# Patient Record
Sex: Female | Born: 1945 | Race: White | Hispanic: No | Marital: Married | Smoking: Never smoker
Health system: Southern US, Community
[De-identification: ages and names within clinical notes are randomized; demographics above are authoritative.]

## PROBLEM LIST (undated history)

## (undated) DIAGNOSIS — F32A Depression, unspecified: Secondary | ICD-10-CM

## (undated) DIAGNOSIS — J45909 Unspecified asthma, uncomplicated: Secondary | ICD-10-CM

## (undated) DIAGNOSIS — E785 Hyperlipidemia, unspecified: Secondary | ICD-10-CM

## (undated) DIAGNOSIS — M1990 Unspecified osteoarthritis, unspecified site: Secondary | ICD-10-CM

## (undated) DIAGNOSIS — I10 Essential (primary) hypertension: Secondary | ICD-10-CM

## (undated) DIAGNOSIS — J449 Chronic obstructive pulmonary disease, unspecified: Secondary | ICD-10-CM

## (undated) DIAGNOSIS — E119 Type 2 diabetes mellitus without complications: Secondary | ICD-10-CM

## (undated) HISTORY — DX: Hyperlipidemia, unspecified: E78.5

## (undated) HISTORY — DX: Unspecified osteoarthritis, unspecified site: M19.90

## (undated) HISTORY — DX: Chronic obstructive pulmonary disease, unspecified: J44.9

## (undated) HISTORY — PX: TOTAL VAGINAL HYSTERECTOMY: SHX2548

## (undated) HISTORY — DX: Type 2 diabetes mellitus without complications: E11.9

## (undated) HISTORY — PX: BREAST BIOPSY: SHX20

## (undated) HISTORY — DX: Depression, unspecified: F32.A

## (undated) HISTORY — DX: Essential (primary) hypertension: I10

## (undated) HISTORY — PX: TUBAL LIGATION: SHX77

## (undated) HISTORY — DX: Unspecified asthma, uncomplicated: J45.909

---

## 1998-12-09 HISTORY — PX: BLADDER SURGERY: SHX569

## 2015-10-11 ENCOUNTER — Other Ambulatory Visit: Payer: Self-pay | Admitting: Family Medicine

## 2015-10-11 DIAGNOSIS — Z1231 Encounter for screening mammogram for malignant neoplasm of breast: Secondary | ICD-10-CM

## 2015-10-20 ENCOUNTER — Ambulatory Visit
Admission: RE | Admit: 2015-10-20 | Discharge: 2015-10-20 | Disposition: A | Discharge status: Home / Self Care | Payer: Medicare Other | Source: Ambulatory Visit | Attending: Family Medicine | Admitting: Family Medicine

## 2015-10-20 ENCOUNTER — Other Ambulatory Visit: Payer: Self-pay | Admitting: Family Medicine

## 2015-10-20 DIAGNOSIS — Z1231 Encounter for screening mammogram for malignant neoplasm of breast: Secondary | ICD-10-CM | POA: Insufficient documentation

## 2016-12-09 HISTORY — PX: ABDOMINAL HYSTERECTOMY: SHX81

## 2018-01-27 ENCOUNTER — Other Ambulatory Visit: Payer: Self-pay | Admitting: Family Medicine

## 2018-01-27 DIAGNOSIS — Z1239 Encounter for other screening for malignant neoplasm of breast: Secondary | ICD-10-CM

## 2018-01-30 ENCOUNTER — Ambulatory Visit
Admission: RE | Admit: 2018-01-30 | Discharge: 2018-01-30 | Disposition: A | Discharge status: Home / Self Care | Payer: Medicare Other | Source: Ambulatory Visit | Attending: Family Medicine | Admitting: Family Medicine

## 2018-01-30 DIAGNOSIS — Z1231 Encounter for screening mammogram for malignant neoplasm of breast: Secondary | ICD-10-CM | POA: Insufficient documentation

## 2018-01-30 DIAGNOSIS — Z1239 Encounter for other screening for malignant neoplasm of breast: Secondary | ICD-10-CM

## 2018-02-02 ENCOUNTER — Inpatient Hospital Stay
Admission: RE | Admit: 2018-02-02 | Discharge: 2018-02-02 | Disposition: A | Discharge status: Home / Self Care | Payer: Self-pay | Source: Ambulatory Visit | Attending: *Deleted | Admitting: *Deleted

## 2018-02-02 ENCOUNTER — Other Ambulatory Visit: Payer: Self-pay | Admitting: *Deleted

## 2018-02-02 DIAGNOSIS — Z9289 Personal history of other medical treatment: Secondary | ICD-10-CM

## 2018-08-09 DIAGNOSIS — C801 Malignant (primary) neoplasm, unspecified: Secondary | ICD-10-CM

## 2018-08-09 HISTORY — DX: Malignant (primary) neoplasm, unspecified: C80.1

## 2019-02-02 ENCOUNTER — Other Ambulatory Visit: Payer: Self-pay | Admitting: Family Medicine

## 2019-02-02 DIAGNOSIS — Z1231 Encounter for screening mammogram for malignant neoplasm of breast: Secondary | ICD-10-CM

## 2019-02-11 ENCOUNTER — Ambulatory Visit
Admission: RE | Admit: 2019-02-11 | Discharge: 2019-02-11 | Disposition: A | Discharge status: Home / Self Care | Payer: Medicare Other | Source: Ambulatory Visit | Attending: Family Medicine | Admitting: Family Medicine

## 2019-02-11 DIAGNOSIS — Z1231 Encounter for screening mammogram for malignant neoplasm of breast: Secondary | ICD-10-CM | POA: Diagnosis present

## 2019-02-12 ENCOUNTER — Ambulatory Visit: Payer: PRIVATE HEALTH INSURANCE

## 2019-12-30 ENCOUNTER — Other Ambulatory Visit: Payer: Self-pay | Admitting: Family Medicine

## 2019-12-30 DIAGNOSIS — Z1231 Encounter for screening mammogram for malignant neoplasm of breast: Secondary | ICD-10-CM

## 2020-02-17 ENCOUNTER — Ambulatory Visit
Admission: RE | Admit: 2020-02-17 | Discharge: 2020-02-17 | Disposition: A | Discharge status: Home / Self Care | Payer: Medicare Other | Source: Ambulatory Visit | Attending: Family Medicine | Admitting: Family Medicine

## 2020-02-17 DIAGNOSIS — Z1231 Encounter for screening mammogram for malignant neoplasm of breast: Secondary | ICD-10-CM | POA: Diagnosis not present

## 2020-03-23 IMAGING — MG DIGITAL SCREENING BILAT W/ TOMO W/ CAD
8 series · 8 of 24 positions shown · non-contrast
Comparison: Previous exam(s).

CLINICAL DATA: Screening.

EXAM:
DIGITAL SCREENING BILATERAL MAMMOGRAM WITH TOMO AND CAD

[R MLO synth-2D]
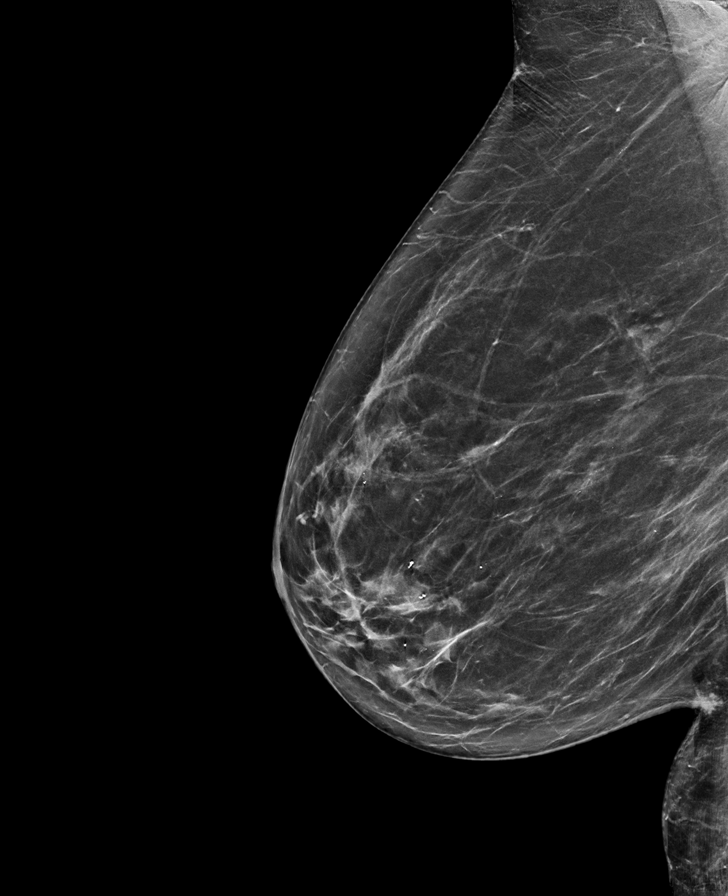

[L MLO synth-2D]
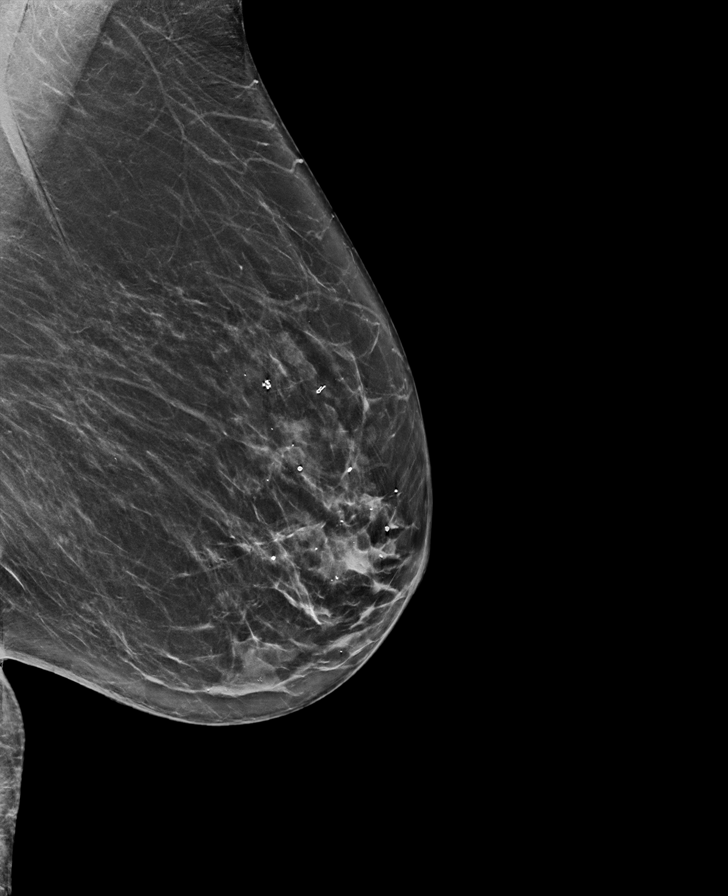

[R CC synth-2D]
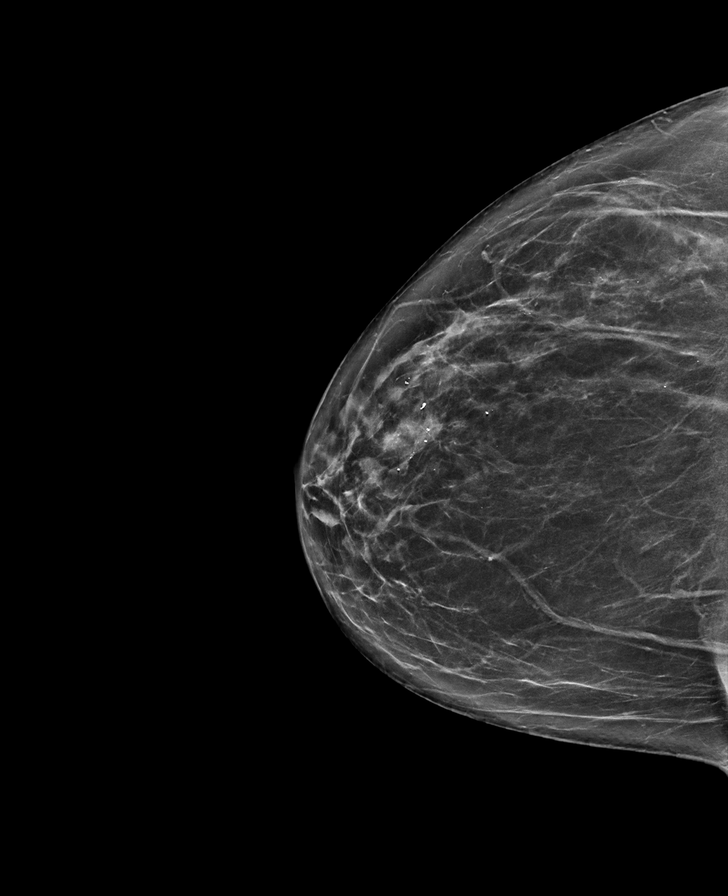

[L CC synth-2D]
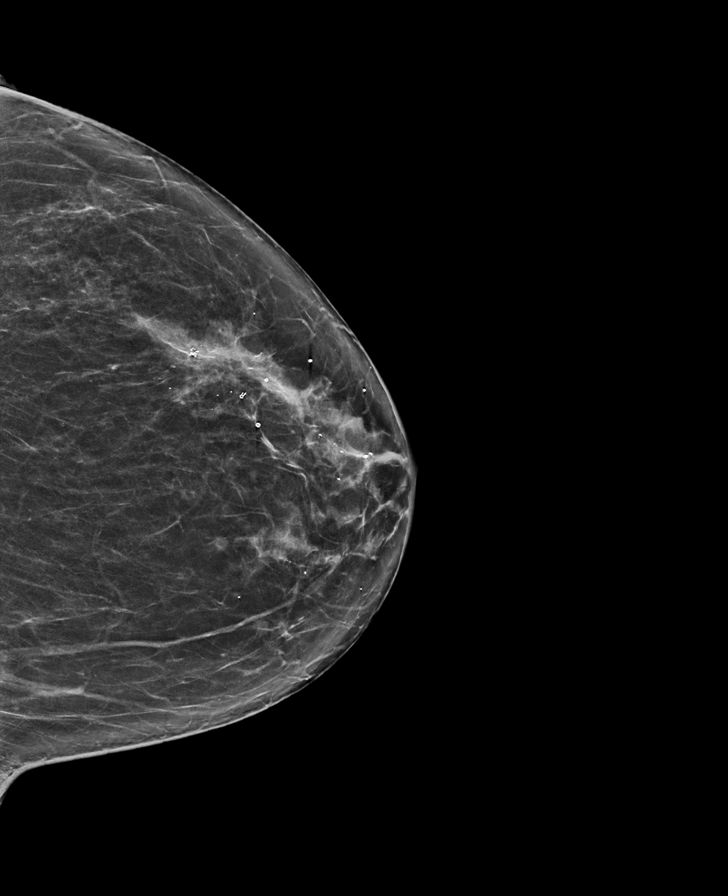

[L CC tomo · tomo slice 35/69.0]
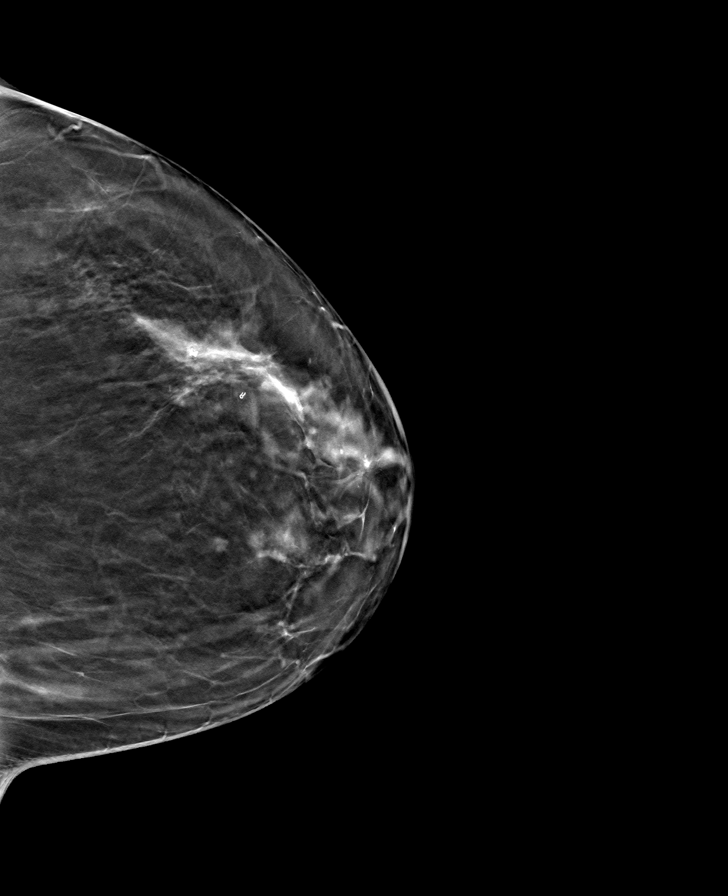

[R CC tomo · tomo slice 37/74.0]
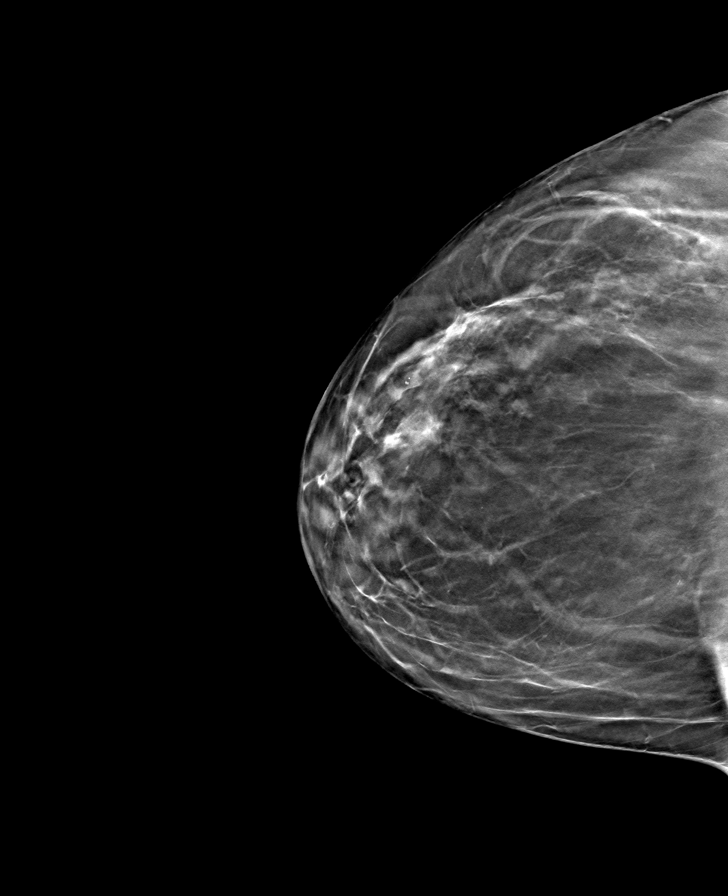

[R MLO tomo · tomo slice 37/73.0]
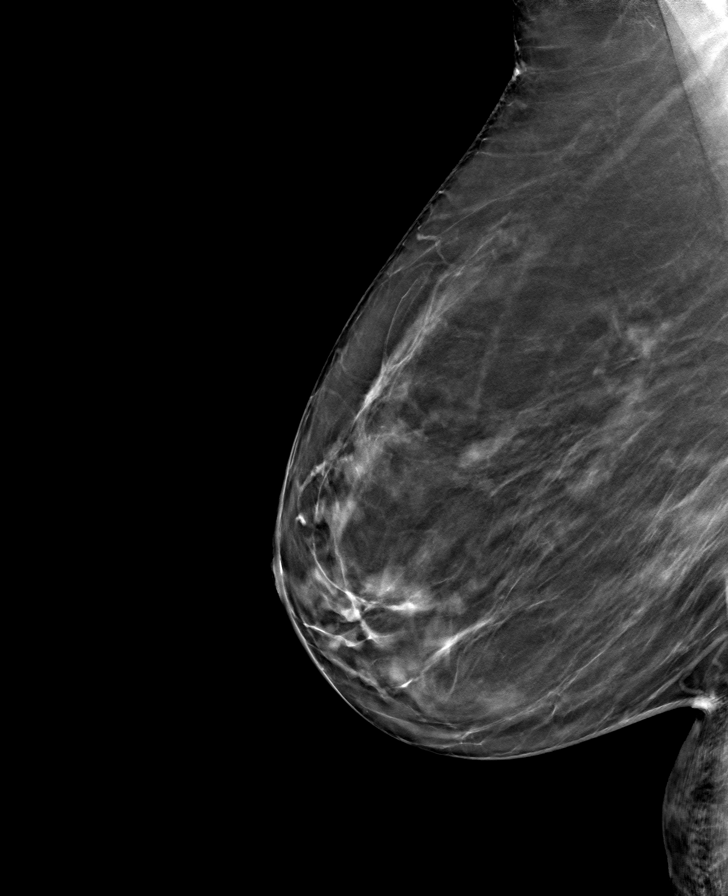

[L MLO tomo · tomo slice 39/77.0]
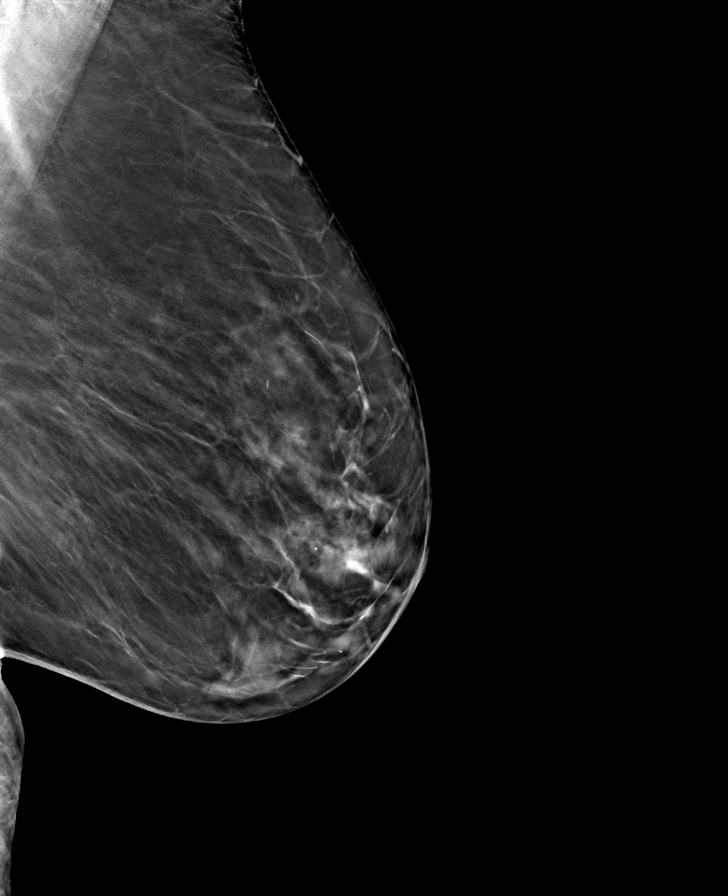

[8 of 24 positions shown; findings below may reference images not displayed]

ACR Breast Density Category c: The breast tissue is heterogeneously
dense, which may obscure small masses.
FINDINGS: There are no findings suspicious for malignancy. Images were
processed with CAD.
IMPRESSION: No mammographic evidence of malignancy. A result letter of this
screening mammogram will be mailed directly to the patient.

RECOMMENDATION:
Screening mammogram in one year. (Code:[id number]-[id number])

BI-RADS CATEGORY  1: Negative.

## 2021-02-05 ENCOUNTER — Other Ambulatory Visit: Payer: Self-pay | Admitting: Family Medicine

## 2021-02-05 DIAGNOSIS — Z1231 Encounter for screening mammogram for malignant neoplasm of breast: Secondary | ICD-10-CM

## 2021-02-19 ENCOUNTER — Ambulatory Visit: Payer: PRIVATE HEALTH INSURANCE

## 2022-06-24 ENCOUNTER — Ambulatory Visit: Payer: Self-pay | Admitting: Nurse Practitioner

## 2022-07-15 LAB — HM DIABETES EYE EXAM

## 2022-07-17 LAB — HM PAP SMEAR: HM Pap smear: NORMAL

## 2022-08-15 LAB — HM DIABETES EYE EXAM

## 2022-08-19 ENCOUNTER — Other Ambulatory Visit: Payer: Self-pay | Admitting: Internal Medicine

## 2022-08-20 ENCOUNTER — Ambulatory Visit
Admission: RE | Admit: 2022-08-20 | Discharge: 2022-08-20 | Disposition: A | Discharge status: Home / Self Care | Payer: Medicare Other | Attending: Internal Medicine | Admitting: Internal Medicine

## 2022-08-20 ENCOUNTER — Ambulatory Visit (INDEPENDENT_AMBULATORY_CARE_PROVIDER_SITE_OTHER): Payer: Medicare Other | Admitting: Internal Medicine

## 2022-08-20 ENCOUNTER — Encounter: Payer: Self-pay | Admitting: Internal Medicine

## 2022-08-20 ENCOUNTER — Ambulatory Visit
Admission: RE | Admit: 2022-08-20 | Discharge: 2022-08-20 | Disposition: A | Discharge status: Home / Self Care | Payer: Medicare Other | Source: Ambulatory Visit | Attending: Internal Medicine | Admitting: Internal Medicine

## 2022-08-20 ENCOUNTER — Other Ambulatory Visit: Payer: Self-pay | Admitting: Internal Medicine

## 2022-08-20 VITALS — BP 138/70 | HR 60 | Ht 61.0 in | Wt 126.0 lb

## 2022-08-20 DIAGNOSIS — Z8542 Personal history of malignant neoplasm of other parts of uterus: Secondary | ICD-10-CM

## 2022-08-20 DIAGNOSIS — E1169 Type 2 diabetes mellitus with other specified complication: Secondary | ICD-10-CM

## 2022-08-20 DIAGNOSIS — Z23 Encounter for immunization: Secondary | ICD-10-CM | POA: Diagnosis not present

## 2022-08-20 DIAGNOSIS — I10 Essential (primary) hypertension: Secondary | ICD-10-CM

## 2022-08-20 DIAGNOSIS — R7303 Prediabetes: Secondary | ICD-10-CM | POA: Diagnosis not present

## 2022-08-20 DIAGNOSIS — M79672 Pain in left foot: Secondary | ICD-10-CM

## 2022-08-20 DIAGNOSIS — J4520 Mild intermittent asthma, uncomplicated: Secondary | ICD-10-CM | POA: Insufficient documentation

## 2022-08-20 DIAGNOSIS — E785 Hyperlipidemia, unspecified: Secondary | ICD-10-CM

## 2022-08-20 DIAGNOSIS — E118 Type 2 diabetes mellitus with unspecified complications: Secondary | ICD-10-CM

## 2022-08-20 MED ORDER — POTASSIUM CHLORIDE ER 10 MEQ PO TBCR: 10.0000 meq | EXTENDED_RELEASE_TABLET | Freq: Every day | ORAL | 1 refills | Status: DC

## 2022-08-20 MED ORDER — FLUTICASONE-SALMETEROL 100-50 MCG/ACT IN AEPB: 1.0000 | INHALATION_SPRAY | Freq: Two times a day (BID) | RESPIRATORY_TRACT | 1 refills | Status: DC

## 2022-08-20 MED ORDER — HYDROCHLOROTHIAZIDE 25 MG PO TABS: ORAL_TABLET | ORAL | 1 refills | Status: DC

## 2022-08-20 MED ORDER — BENAZEPRIL HCL 40 MG PO TABS: 40.0000 mg | ORAL_TABLET | Freq: Every day | ORAL | 1 refills | Status: DC

## 2022-08-20 MED ORDER — CARVEDILOL 12.5 MG PO TABS: 12.5000 mg | ORAL_TABLET | Freq: Two times a day (BID) | ORAL | 1 refills | Status: DC

## 2022-08-20 MED ORDER — ROSUVASTATIN CALCIUM 5 MG PO TABS: 5.0000 mg | ORAL_TABLET | Freq: Every day | ORAL | 1 refills | Status: DC

## 2022-08-20 MED ORDER — MONTELUKAST SODIUM 10 MG PO TABS: ORAL_TABLET | ORAL | 1 refills | Status: DC

## 2022-08-20 NOTE — Patient Instructions (Addendum)
Call the [facility] for a list of your vaccines:  Pneumonia  Shingles  Tetanus  Please ask for a copy of your mammogram

## 2022-08-20 NOTE — Progress Notes (Signed)
Date:  08/20/2022   Name:  Kimberly Robertson   DOB:  May 31, 1946   MRN:  [id number]   Chief Complaint: Foot Pain (Left foot ) and Flu Vaccine  Foot Pain This is a new problem. Episode onset: X1 month. The problem has been unchanged. Associated symptoms include arthralgias (foot pain). Pertinent negatives include no abdominal pain, chest pain, coughing, fatigue, fever, headaches, numbness or weakness. The symptoms are aggravated by walking. She has tried nothing for the symptoms.  Hypertension This is a chronic problem. The problem is controlled. Pertinent negatives include no chest pain, headaches, palpitations or shortness of breath. Past treatments include ACE inhibitors, beta blockers and diuretics.  Diabetes She presents for her follow-up diabetic visit. She has type 2 diabetes mellitus. Pertinent negatives for hypoglycemia include no dizziness, headaches, nervousness/anxiousness or tremors. Pertinent negatives for diabetes include no chest pain, no fatigue, no polydipsia, no polyuria and no weakness. Current diabetic treatment includes diet.  Hyperlipidemia This is a chronic problem. Pertinent negatives include no chest pain or shortness of breath. Current antihyperlipidemic treatment includes statins.    No results found for: "NA", "K", "CO2", "GLUCOSE", "BUN", "CREATININE", "CALCIUM", "EGFR", "GFRNONAA" No results found for: "CHOL", "HDL", "LDLCALC", "LDLDIRECT", "TRIG", "CHOLHDL" No results found for: "TSH" No results found for: "HGBA1C" No results found for: "WBC", "HGB", "HCT", "MCV", "PLT" No results found for: "ALT", "AST", "GGT", "ALKPHOS", "BILITOT" No results found for: "25OHVITD2", "25OHVITD3", "VD25OH"   Review of Systems  Constitutional:  Negative for appetite change, fatigue, fever and unexpected weight change.  HENT:  Negative for nosebleeds, tinnitus and trouble swallowing.   Eyes:  Negative for visual disturbance.  Respiratory:  Negative for cough, chest tightness,  shortness of breath and wheezing.   Cardiovascular:  Negative for chest pain, palpitations and leg swelling.  Gastrointestinal:  Negative for abdominal pain, constipation and diarrhea.  Endocrine: Negative for polydipsia and polyuria.  Genitourinary:  Negative for dysuria and hematuria.  Musculoskeletal:  Positive for arthralgias (foot pain).  Neurological:  Negative for dizziness, tremors, weakness, light-headedness, numbness and headaches.  Psychiatric/Behavioral:  Negative for dysphoric mood and sleep disturbance. The patient is not nervous/anxious.     Patient Active Problem List   Diagnosis Date Noted   Essential hypertension 08/20/2022   Type II diabetes mellitus with complication ([facility]) 09/32/3557   Hyperlipidemia associated with type 2 diabetes mellitus ([facility]) 08/20/2022   Mild intermittent asthma without complication 32/20/2542    Allergies  Allergen Reactions   Clarithromycin Other (See Comments)    Past Surgical History:  Procedure Laterality Date   ABDOMINAL HYSTERECTOMY  2018   BLADDER SURGERY  2000   and 2018   BREAST BIOPSY Left    2004-benign    Social History   Tobacco Use   Smoking status: Never   Smokeless tobacco: Never  Vaping Use   Vaping Use: Never used  Substance Use Topics   Alcohol use: Not Currently   Drug use: Never     Medication list has been reviewed and updated.  Current Meds  Medication Sig   albuterol (VENTOLIN HFA) 108 (90 Base) MCG/ACT inhaler Inhale into the lungs every 6 (six) hours as needed for wheezing or shortness of breath.   ASPIRIN 81 PO aspirin   b complex vitamins capsule Take 1 capsule by mouth daily.   benazepril (LOTENSIN) 40 MG tablet Take 1 tablet every day by oral route.   carvedilol (COREG) 12.5 MG tablet Take 1 tablet twice a day by oral route.  citalopram (CELEXA) 10 MG tablet Take 10 mg by mouth daily.   fluticasone-salmeterol (ADVAIR) 100-50 MCG/ACT AEPB Inhale 1 puff into the lungs 2 (two) times daily.    hydrochlorothiazide (HYDRODIURIL) 25 MG tablet Take 1 tablet every day by oral route.   montelukast (SINGULAIR) 10 MG tablet Take 1 tablet every day by oral route.   multivitamin-lutein (OCUVITE-LUTEIN) CAPS capsule Take 1 capsule by mouth daily.   potassium chloride (KLOR-CON) 10 MEQ tablet Take 10 mEq by mouth daily.   rosuvastatin (CRESTOR) 5 MG tablet Take 5 mg by mouth daily.   UNABLE TO FIND in the morning and at bedtime. Med Name: Sciatic Ease   [DISCONTINUED] rosuvastatin (CRESTOR) 5 MG tablet Take 5 mg by mouth daily.       08/20/2022    2:37 PM  GAD 7 : Generalized Anxiety Score  Nervous, Anxious, on Edge 0  Control/stop worrying 2  Worry too much - different things 0  Trouble relaxing 0  Restless 0  Easily annoyed or irritable 0  Afraid - awful might happen 0  Total GAD 7 Score 2  Anxiety Difficulty Not difficult at all       08/20/2022    2:36 PM  Depression screen PHQ 2/9  Decreased Interest 0  Down, Depressed, Hopeless 0  PHQ - 2 Score 0  Altered sleeping 0  Tired, decreased energy 0  Change in appetite 0  Feeling bad or failure about yourself  0  Trouble concentrating 0  Moving slowly or fidgety/restless 0  Suicidal thoughts 0  PHQ-9 Score 0  Difficult doing work/chores Not difficult at all    BP Readings from Last 3 Encounters:  08/20/22 138/70    Physical Exam Vitals and nursing note reviewed.  Constitutional:      General: She is not in acute distress.    Appearance: Normal appearance. She is well-developed.  HENT:     Head: Normocephalic and atraumatic.  Neck:     Vascular: No carotid bruit.  Cardiovascular:     Rate and Rhythm: Normal rate and regular rhythm.     Heart sounds: No murmur heard. Pulmonary:     Effort: Pulmonary effort is normal. No respiratory distress.     Breath sounds: No wheezing or rhonchi.  Musculoskeletal:        General: Normal range of motion.     Cervical back: Normal range of motion.     Right lower leg: No  edema.     Left lower leg: No edema.  Feet:     Comments: Tender over left lateral distal forefoot No deformity or swelling, redness Lymphadenopathy:     Cervical: No cervical adenopathy.  Skin:    General: Skin is warm and dry.     Capillary Refill: Capillary refill takes less than 2 seconds.     Findings: No rash.  Neurological:     General: No focal deficit present.     Mental Status: She is alert and oriented to person, place, and time.  Psychiatric:        Mood and Affect: Mood normal.        Behavior: Behavior normal.     Wt Readings from Last 3 Encounters:  08/20/22 126 lb (57.2 kg)    BP 138/70 (BP Location: Left Arm, Cuff Size: Normal)   Pulse 60   Ht '5\' 1"'  (1.549 m)   Wt 126 lb (57.2 kg)   SpO2 98%   BMI 23.81 kg/m   Assessment and  Plan: 1. Essential hypertension Clinically stable exam with well controlled BP. Tolerating medications without side effects at this time. Pt to continue current regimen and low sodium diet; benefits of regular exercise as able discussed. - CBC with Differential/Platelet - Comprehensive metabolic panel - potassium chloride (KLOR-CON) 10 MEQ tablet; Take 1 tablet (10 mEq total) by mouth daily.  Dispense: 90 tablet; Refill: 1 - hydrochlorothiazide (HYDRODIURIL) 25 MG tablet; Take 1 tablet every day by oral route.  Dispense: 90 tablet; Refill: 1 - carvedilol (COREG) 12.5 MG tablet; Take 1 tablet (12.5 mg total) by mouth 2 (two) times daily with a meal.  Dispense: 180 tablet; Refill: 1 - benazepril (LOTENSIN) 40 MG tablet; Take 1 tablet (40 mg total) by mouth daily.  Dispense: 90 tablet; Refill: 1  2. Foot pain, left Suspect OA vs stress fracture - DG Foot Complete Left; Future  3. Prediabetes Not currently on any medications Will check labs and advise Low carb diet for now - Comprehensive metabolic panel - Hemoglobin A1c  4. Hyperlipidemia associated with type 2 diabetes mellitus (HCC) - Lipid panel - rosuvastatin (CRESTOR) 5  MG tablet; Take 1 tablet (5 mg total) by mouth daily.  Dispense: 90 tablet; Refill: 1  5. Mild intermittent asthma without complication Stable with only intermittent symptoms  She uses nebulizer albuterol PRN - montelukast (SINGULAIR) 10 MG tablet; Take 1 tablet every day by oral route.  Dispense: 90 tablet; Refill: 1 - fluticasone-salmeterol (ADVAIR) 100-50 MCG/ACT AEPB; Inhale 1 puff into the lungs 2 (two) times daily.  Dispense: 180 each; Refill: 1   Partially dictated using Editor, commissioning. Any errors are unintentional.  Halina Maidens, MD [facility]  08/20/2022

## 2022-08-21 LAB — COMPREHENSIVE METABOLIC PANEL
ALT: 23 IU/L (ref 0–32)
AST: 25 IU/L (ref 0–40)
Albumin/Globulin Ratio: 1.9 (ref 1.2–2.2)
Albumin: 4.7 g/dL (ref 3.8–4.8)
Alkaline Phosphatase: 89 IU/L (ref 44–121)
BUN/Creatinine Ratio: 18 (ref 12–28)
BUN: 14 mg/dL (ref 8–27)
Bilirubin Total: 0.5 mg/dL (ref 0.0–1.2)
CO2: 26 mmol/L (ref 20–29)
Calcium: 10.2 mg/dL (ref 8.7–10.3)
Chloride: 100 mmol/L (ref 96–106)
Creatinine, Ser: 0.79 mg/dL (ref 0.57–1.00)
Globulin, Total: 2.5 g/dL (ref 1.5–4.5)
Glucose: 93 mg/dL (ref 70–99)
Potassium: 3.8 mmol/L (ref 3.5–5.2)
Sodium: 140 mmol/L (ref 134–144)
Total Protein: 7.2 g/dL (ref 6.0–8.5)
eGFR: 77 mL/min/{1.73_m2} (ref >59)

## 2022-08-21 LAB — CBC WITH DIFFERENTIAL/PLATELET
Basophils Absolute: 0 10*3/uL (ref 0.0–0.2)
Basos: 1 %
EOS (ABSOLUTE): 0.2 10*3/uL (ref 0.0–0.4)
Eos: 4 %
Hematocrit: 42 % (ref 34.0–46.6)
Hemoglobin: 14.4 g/dL (ref 11.1–15.9)
Immature Grans (Abs): 0 10*3/uL (ref 0.0–0.1)
Immature Granulocytes: 0 %
Lymphocytes Absolute: 1.8 10*3/uL (ref 0.7–3.1)
Lymphs: 34 %
MCH: 31.3 pg (ref 26.6–33.0)
MCHC: 34.3 g/dL (ref 31.5–35.7)
MCV: 91 fL (ref 79–97)
Monocytes Absolute: 0.5 10*3/uL (ref 0.1–0.9)
Monocytes: 9 %
Neutrophils Absolute: 2.8 10*3/uL (ref 1.4–7.0)
Neutrophils: 52 %
Platelets: 213 10*3/uL (ref 150–450)
RBC: 4.6 x10E6/uL (ref 3.77–5.28)
RDW: 12.3 % (ref 11.7–15.4)
WBC: 5.3 10*3/uL (ref 3.4–10.8)

## 2022-08-21 LAB — LIPID PANEL
Chol/HDL Ratio: 2.2 ratio (ref 0.0–4.4)
Cholesterol, Total: 184 mg/dL (ref 100–199)
HDL: 82 mg/dL (ref >39)
LDL Chol Calc (NIH): 83 mg/dL (ref 0–99)
Triglycerides: 107 mg/dL (ref 0–149)
VLDL Cholesterol Cal: 19 mg/dL (ref 5–40)

## 2022-08-21 LAB — HEMOGLOBIN A1C
Est. average glucose Bld gHb Est-mCnc: 143 mg/dL
Hgb A1c MFr Bld: 6.6 % — ABNORMAL HIGH (ref 4.8–5.6)

## 2022-08-22 NOTE — Progress Notes (Signed)
Called pt could not leave VM. VM not set up.  PEC nurse may give results to patient if they return call to clinic, a CRM has been created.  KP

## 2022-08-28 ENCOUNTER — Encounter: Payer: Self-pay | Admitting: Internal Medicine

## 2022-08-28 NOTE — Telephone Encounter (Signed)
FYI

## 2022-08-30 ENCOUNTER — Other Ambulatory Visit: Payer: Self-pay

## 2022-08-30 DIAGNOSIS — Z79899 Other long term (current) drug therapy: Secondary | ICD-10-CM

## 2022-08-30 NOTE — Telephone Encounter (Signed)
DONE

## 2022-08-30 NOTE — Telephone Encounter (Signed)
Pt response.  KP

## 2022-09-03 ENCOUNTER — Other Ambulatory Visit: Payer: Self-pay

## 2022-09-03 DIAGNOSIS — J4520 Mild intermittent asthma, uncomplicated: Secondary | ICD-10-CM

## 2022-09-05 ENCOUNTER — Telehealth: Payer: Self-pay | Admitting: Pharmacist

## 2022-09-05 NOTE — Progress Notes (Signed)
Contacted patient regarding referral for medication access from Glean Hess, MD .   Appointment scheduled   Catie Hedwig Morton, PharmD, [facility] [phone]

## 2022-09-12 ENCOUNTER — Other Ambulatory Visit: Payer: PRIVATE HEALTH INSURANCE | Admitting: Pharmacist

## 2022-09-12 NOTE — Patient Instructions (Addendum)
Cattie,   Call your old pharmacy to see if you have been on "atorvastatin" for your cholesterol, or if they have documented that you had any problems with it, if you have been on it.   Call [facility] at [phone] to discuss the eligibility application.   Call me when you have that completed. Thanks!  Catie Hedwig Morton, PharmD, [facility] [phone]

## 2022-09-12 NOTE — Progress Notes (Signed)
Chief Complaint  Patient presents with   Medication Management    Kimberly Robertson is a 76 y.o. year old female who presented for a telephone visit.   They were referred to the pharmacist by their PCP for assistance in managing medication access.   Subjective:  Care Team: Primary Care Provider: Glean Hess, MD ; Next Scheduled Visit: 12/20/22  Medication Access/Adherence  Current Pharmacy:  [place], [place] - [address] [place] [address] Phone: [phone] Fax: [phone]  [place], [address] [place] [address] Phone: [phone] Fax: [phone]  [facility], [place], [place] [address], [place] [address] Phone: [phone] Fax: [phone]   Patient reports affordability concerns with their medications: Yes  Patient reports access/transportation concerns to their pharmacy: No  Patient reports adherence concerns with their medications:  No    Patient only has Medicare A/B and a G supplement, but no prescription drug coverage. She was previously using a patient assistance savings program with her prior PCP that advertised a $10 copay for her inhalers.    Asthma:  Current medications: Advair 100/50 mcg 1 puff twice daily, albuterol HFA PRN  Hypertension:  Current medications: benazepril 40 mg daily, HCTZ 25 mg daily  Hyperlipidemia/ASCVD Risk Reduction  Current lipid lowering medications: rosuvastatin 5 mg daily  Antiplatelet regimen: aspirin 81 mg daily   Health Maintenance  Health Maintenance Due  Topic Date Due   Diabetic kidney evaluation - Urine ACR  Never done   Hepatitis C Screening  Never done   TETANUS/TDAP  Never done   Pneumonia Vaccine 48+ Years old (1 - PCV) Never done   DEXA SCAN  Never done     Objective: Lab  Results  Component Value Date   HGBA1C 6.6 (H) 08/20/2022    Lab Results  Component Value Date   CREATININE 0.79 08/20/2022   BUN 14 08/20/2022   NA 140 08/20/2022   K 3.8 08/20/2022   CL 100 08/20/2022   CO2 26 08/20/2022    Lab Results  Component Value Date   CHOL 184 08/20/2022   HDL 82 08/20/2022   LDLCALC 83 08/20/2022   TRIG 107 08/20/2022   CHOLHDL 2.2 08/20/2022    Medications Reviewed Today     Reviewed by Osker Mason, RPH-CPP (Pharmacist) on 09/12/22 at 1359  Med List Status: <None>   Medication Order Taking? Sig Documenting Provider Last Dose Status Informant  albuterol (VENTOLIN HFA) 108 (90 Base) MCG/ACT inhaler [id number] Yes Inhale into the lungs every 6 (six) hours as needed for wheezing or shortness of breath. [provider] Taking Active   ASPIRIN 81 PO [id number] Yes aspirin [provider] Taking Active   b complex vitamins capsule [id number] Yes Take 1 capsule by mouth daily. [provider] Taking Active   benazepril (LOTENSIN) 40 MG tablet [id number] Yes Take 1 tablet (40 mg total) by mouth daily. Glean Hess, MD Taking Active   calcium carbonate (OS-CAL) 600 MG tablet [id number] Yes Take 600 mg by mouth 2 (two) times daily with a meal. [provider] Taking Active   carvedilol (COREG) 12.5 MG tablet [id number] Yes Take 1 tablet (12.5 mg total) by mouth 2 (two) times daily with a meal. Glean Hess, MD Taking Active   fluticasone-salmeterol (  ADVAIR) 100-50 MCG/ACT AEPB [id number] Yes Inhale 1 puff into the lungs 2 (two) times daily. Glean Hess, MD Taking Active   hydrochlorothiazide (HYDRODIURIL) 25 MG tablet [id number] Yes Take 1 tablet every day by oral route. Glean Hess, MD Taking Active   montelukast (SINGULAIR) 10 MG tablet [id number] Yes Take 1 tablet every day by oral route. Glean Hess, MD Taking Active   multivitamin-lutein [facility]) CAPS capsule [id number] Yes Take 1  capsule by mouth daily. [provider] Taking Active   potassium chloride (KLOR-CON) 10 MEQ tablet [id number] Yes Take 1 tablet (10 mEq total) by mouth daily. Glean Hess, MD Taking Active   rosuvastatin (CRESTOR) 5 MG tablet [id number] Yes Take 1 tablet (5 mg total) by mouth daily. Glean Hess, MD Taking Active   UNABLE TO FIND [id number]  in the morning and at bedtime. Med Name: Sciatic Ease [provider]  Active               Assessment/Plan:   Discussed income. Due to lack of prescription coverage and income <300% FPL, patient qualifies for [facility] free mail order pharmacy. Provided phone number, patient will call and complete enrollment.   Hypertension: - Currently controlled - Reviewed [facility] formulary. Benazepril is not on formulary, but lisinopril, HCTZ, and the combination are. If patient is deemed eligible, will discuss changing from benazepril to lisinopril with Dr. Army Melia.  Hyperlipidemia/ASCVD Risk Reduction: - Currently controlled.  - Rosuvastatin is not on [facility] formulary, but atorvastatin is. Patient is unsure if she has tried atorvastatin before - appears it has been on her profile but she doesn't remember the name. She will call her old pharmacy to see if she has taken if before. If she needs to stay on rosuvastatin, it is $10 per 90 day supply at [facility] pharmacies. Patient will call me back after she has investigated.    Asthma: - Currently controlled.  - Generic Advair and albuterol are on [facility] formulary. Once patient is eligible, will collaborate with Dr. Army Melia to send refills   Follow Up Plan: patient will call me back after talking with Stem, PharmD, [facility] [phone]

## 2022-09-13 ENCOUNTER — Other Ambulatory Visit: Payer: Self-pay | Admitting: Pharmacist

## 2022-09-13 DIAGNOSIS — E1169 Type 2 diabetes mellitus with other specified complication: Secondary | ICD-10-CM

## 2022-09-13 NOTE — Progress Notes (Signed)
Care Coordination Call  Received voicemail from patient. She called her prior pharmacy; she had previously been on atorvastatin 2016-2020, there was no documentation of prior allergy or intolerance.   She will call [facility] regarding enrollment.   Catie Hedwig Morton, PharmD, [facility] [phone]

## 2022-09-23 ENCOUNTER — Telehealth: Payer: Self-pay | Admitting: Pharmacist

## 2022-09-23 NOTE — Progress Notes (Unsigned)
Attempted to contact patient to follow up on medication access needs.  Left HIPAA compliant message for patient to return my call at their convenience.   Catie Hedwig Morton, PharmD, [facility] [phone]

## 2022-09-24 NOTE — Progress Notes (Signed)
Received voicemail from patient.   Note she had a difficult time getting logged into MyChart and she got a new phone and is having issues checking her voicemail. Her voicemail to me stated that she thinks her daughter took care of getting refills from [facility], but she is unsure. She also mentioned that her husband didn't want her to apply for Medicaid, which I think was a misunderstanding - we did not discuss applying for Medicaid. We had discussed working with [facility], a discount pharmacy for patients without prescription drug insurance.   Called patient back. Left voicemail letting her know to call if she still needs support with medication access.   Catie Hedwig Morton, PharmD, [facility] [phone]

## 2022-10-06 ENCOUNTER — Encounter: Payer: Self-pay | Admitting: Internal Medicine

## 2022-10-08 ENCOUNTER — Encounter: Payer: Self-pay | Admitting: Internal Medicine

## 2022-10-08 ENCOUNTER — Encounter: Payer: Medicare Other | Admitting: Family Medicine

## 2022-10-14 ENCOUNTER — Encounter: Payer: Self-pay | Admitting: Family Medicine

## 2022-10-14 ENCOUNTER — Encounter: Payer: 59 | Admitting: Family Medicine

## 2022-10-14 ENCOUNTER — Ambulatory Visit (INDEPENDENT_AMBULATORY_CARE_PROVIDER_SITE_OTHER): Payer: Medicare Other | Admitting: Family Medicine

## 2022-10-14 VITALS — BP 128/80 | HR 60 | Ht 61.0 in | Wt 129.0 lb

## 2022-10-14 DIAGNOSIS — M19072 Primary osteoarthritis, left ankle and foot: Secondary | ICD-10-CM

## 2022-10-14 MED ORDER — LIDOCAINE 5 % EX PTCH: 1.0000 | MEDICATED_PATCH | Freq: Two times a day (BID) | CUTANEOUS | 2 refills | Status: DC

## 2022-10-14 MED ORDER — DICLOFENAC SODIUM 1 % EX GEL: 2.0000 g | Freq: Four times a day (QID) | CUTANEOUS | 11 refills | Status: DC

## 2022-10-14 NOTE — Patient Instructions (Signed)
-   Use diclofenac 1% (Voltaren gel) up to 4 times a day as needed for foot pain - Can use lidocaine patch for additional pain control - Home exercises provided for foot/ankle - Consider joint health supplements as follows: - Glucosamine/chondroitin - Turmeric - Vitamin D - Fish oil -Contact us for any persistent symptoms despite the above to discuss next steps

## 2022-10-15 NOTE — Assessment & Plan Note (Signed)
Patient with atraumatic left midfoot pain, did have x-rays performed and presents for follow-up.  Noted worse towards the end of the day, described as achiness, no treatments to date, maintains a high level of baseline activity/time on feet.  Examination with tenderness about the metatarsals, lateral midfoot, prominent osteophyte at the dorsum of the midfoot that is nontender.  Secondary tenderness at the first MTP.  Given her radiographs, her pain is most consistent with osteoarthritis, incidentally noted age-indeterminate fracture is essentially asymptomatic.  We discussed various surgical and nonsurgical treatment strategies, at this stage plan on Rx lidocaine patch, topical diclofenac 1%, home exercises, follow-up as needed.  Can consider local ultrasound-guided injections to address local osteoarthritis related pain.

## 2022-10-15 NOTE — Progress Notes (Signed)
     Primary Care / Sports Medicine Office Visit  Patient Information:  Patient ID: Kimberly Robertson, female DOB: November 11, 1946 Age: 76 y.o. MRN: [id number]   Kimberly Robertson is a pleasant 76 y.o. female presenting with the following:  Chief Complaint  Patient presents with   Foot Injury    Not sure how it happened, left foot, bothering her for 2 months    Vitals:   10/14/22 1104  BP: 128/80  Pulse: 60  SpO2: 99%   Vitals:   10/14/22 1104  Weight: 129 lb (58.5 kg)  Height: '5\' 1"'$  (1.549 m)   Body mass index is 24.37 kg/m.  No results found.   Independent interpretation of notes and tests performed by another provider:   Independent interpretation of left foot x-rays reveals prominent midfoot dorsal osteophyte with age-indeterminate lucency, degenerative changes about the midfoot laterally and at the first MTP.  Procedures performed:   None  Pertinent History, Exam, Impression, and Recommendations:   Problem List Items Addressed This Visit       Musculoskeletal and Integument   Osteoarthritis of left foot - Primary    Patient with atraumatic left midfoot pain, did have x-rays performed and presents for follow-up.  Noted worse towards the end of the day, described as achiness, no treatments to date, maintains a high level of baseline activity/time on feet.  Examination with tenderness about the metatarsals, lateral midfoot, prominent osteophyte at the dorsum of the midfoot that is nontender.  Secondary tenderness at the first MTP.  Given her radiographs, her pain is most consistent with osteoarthritis, incidentally noted age-indeterminate fracture is essentially asymptomatic.  We discussed various surgical and nonsurgical treatment strategies, at this stage plan on Rx lidocaine patch, topical diclofenac 1%, home exercises, follow-up as needed.  Can consider local ultrasound-guided injections to address local osteoarthritis related pain.      Relevant Medications   diclofenac  Sodium (VOLTAREN) 1 % GEL   lidocaine (LIDODERM) 5 %     Orders & Medications Meds ordered this encounter  Medications   diclofenac Sodium (VOLTAREN) 1 % GEL    Sig: Apply 2 g topically 4 (four) times daily. To affected joint.    Dispense:  100 g    Refill:  11   lidocaine (LIDODERM) 5 %    Sig: Place 1 patch onto the skin every 12 (twelve) hours. Remove & Discard patch within 12 hours or as directed by MD    Dispense:  30 patch    Refill:  2   No orders of the defined types were placed in this encounter.    No follow-ups on file.     Montel Culver, MD   Primary Care Sports Medicine [place]

## 2022-10-18 ENCOUNTER — Ambulatory Visit (INDEPENDENT_AMBULATORY_CARE_PROVIDER_SITE_OTHER): Payer: Medicare Other

## 2022-10-18 VITALS — Ht 61.0 in | Wt 129.0 lb

## 2022-10-18 DIAGNOSIS — Z0000 Encounter for general adult medical examination without abnormal findings: Secondary | ICD-10-CM

## 2022-10-18 NOTE — Progress Notes (Signed)
Subjective:   Kimberly Robertson is a 76 y.o. female who presents for Medicare Annual (Subsequent) preventive examination.  I connected with  [name] on 10/18/22 by a audio enabled telemedicine application and verified that I am speaking with the correct person using two identifiers.  Patient Location: Home  Provider Location: Office/Clinic  I discussed the limitations of evaluation and management by telemedicine. The patient expressed understanding and agreed to proceed.   Review of Systems    Defer to PCP Cardiac Risk Factors include: advanced age (>80mn, >>71women)     Objective:    Today's Vitals   10/18/22 1326 10/18/22 1332  Weight: 129 lb (58.5 kg)   Height: '5\' 1"'$  (1.549 m)   PainSc: 0-No pain 0-No pain   Body mass index is 24.37 kg/m.     10/18/2022    1:34 PM  Advanced Directives  Does Patient Have a Medical Advance Directive? No  Would patient like information on creating a medical advance directive? No - Guardian declined    Current Medications (verified) Outpatient Encounter Medications as of 10/18/2022  Medication Sig   albuterol (VENTOLIN HFA) 108 (90 Base) MCG/ACT inhaler Inhale into the lungs every 6 (six) hours as needed for wheezing or shortness of breath.   ASPIRIN 81 PO aspirin   b complex vitamins capsule Take 1 capsule by mouth daily.   benazepril (LOTENSIN) 40 MG tablet Take 1 tablet (40 mg total) by mouth daily.   calcium carbonate (OS-CAL) 600 MG tablet Take 600 mg by mouth 2 (two) times daily with a meal.   carvedilol (COREG) 12.5 MG tablet Take 1 tablet (12.5 mg total) by mouth 2 (two) times daily with a meal.   diclofenac Sodium (VOLTAREN) 1 % GEL Apply 2 g topically 4 (four) times daily. To affected joint.   fluticasone-salmeterol (ADVAIR) 100-50 MCG/ACT AEPB Inhale 1 puff into the lungs 2 (two) times daily.   hydrochlorothiazide (HYDRODIURIL) 25 MG tablet Take 1 tablet every day by oral route.   lidocaine (LIDODERM) 5 % Place 1  patch onto the skin every 12 (twelve) hours. Remove & Discard patch within 12 hours or as directed by MD   montelukast (SINGULAIR) 10 MG tablet Take 1 tablet every day by oral route.   multivitamin-lutein (OCUVITE-LUTEIN) CAPS capsule Take 1 capsule by mouth daily.   potassium chloride (KLOR-CON) 10 MEQ tablet Take 1 tablet (10 mEq total) by mouth daily.   rosuvastatin (CRESTOR) 5 MG tablet Take 1 tablet (5 mg total) by mouth daily.   UNABLE TO FIND in the morning and at bedtime. Med Name: Sciatic Ease   No facility-administered encounter medications on file as of 10/18/2022.    Allergies (verified) Clarithromycin   History: Past Medical History:  Diagnosis Date   Asthma    Cancer ([place] 08/2018   endometrial   Depression    Diabetes mellitus without complication ([place]    Hyperlipidemia    Hypertension    Past Surgical History:  Procedure Laterality Date   ABDOMINAL HYSTERECTOMY  2018   BLADDER SURGERY  2000   and 2018   BREAST BIOPSY Left    2004-benign   Family History  Problem Relation Age of Onset   Cancer Father    Heart attack Father    Diabetes Maternal Aunt    Cancer Maternal Aunt    Diabetes Maternal Uncle    Cancer Maternal Uncle    Breast cancer Paternal Aunt    Cancer Paternal Uncle    Social  History   Socioeconomic History   Marital status: Married    Spouse name: Geophysical data processor   Number of children: 2   Years of education: Not on file   Highest education level: Not on file  Occupational History   Not on file  Tobacco Use   Smoking status: Never   Smokeless tobacco: Never  Vaping Use   Vaping Use: Never used  Substance and Sexual Activity   Alcohol use: Not Currently   Drug use: Never   Sexual activity: Not Currently  Other Topics Concern   Not on file  Social History Narrative   Not on file   Social Determinants of Health   Financial Resource Strain: High Risk (09/12/2022)   Overall Financial Resource Strain (CARDIA)     Difficulty of Paying Living Expenses: Hard  Food Insecurity: No Food Insecurity (10/18/2022)   Hunger Vital Sign    Worried About Running Out of Food in the Last Year: Never true    Ran Out of Food in the Last Year: Never true  Transportation Needs: No Transportation Needs (08/20/2022)   PRAPARE - Hydrologist (Medical): No    Lack of Transportation (Non-Medical): No  Physical Activity: Sufficiently Active (10/18/2022)   Exercise Vital Sign    Days of Exercise per Week: 5 days    Minutes of Exercise per Session: 60 min  Stress: No Stress Concern Present (10/18/2022)   [place]    Feeling of Stress : Not at all  Social Connections: Moderately Isolated (10/18/2022)   Social Connection and Isolation Panel [NHANES]    Frequency of Communication with Friends and Family: More than three times a week    Frequency of Social Gatherings with Friends and Family: More than three times a week    Attends Religious Services: Never    Marine scientist or Organizations: No    Attends Music therapist: Never    Marital Status: Married    Tobacco Counseling Counseling given: Not Answered   Clinical Intake:  Pre-visit preparation completed: Yes  Pain : No/denies pain Pain Score: 0-No pain     BMI - recorded: 24.37 Nutritional Status: BMI of 19-24  Normal Nutritional Risks: None Diabetes: No  How often do you need to have someone help you when you read instructions, pamphlets, or other written materials from your doctor or pharmacy?: (P) 1 - Never  Diabetic? No.     Information entered by :: Wyatt Haste, [place] of Daily Living    10/18/2022    1:35 PM 10/18/2022   10:54 AM  In your present state of health, do you have any difficulty performing the following activities:  Hearing? 0 0  Vision? 0 0  Difficulty concentrating or making decisions? 0 0   Walking or climbing stairs? 1 0  Dressing or bathing? 0 0  Doing errands, shopping? 0 0  Preparing Food and eating ? N   Using the Toilet? N N  In the past six months, have you accidently leaked urine? Y Y  Do you have problems with loss of bowel control? N N  Managing your Medications? N N  Managing your Finances? N N  Housekeeping or managing your Housekeeping? N N    Patient Care Team: Glean Hess, MD as PCP - General (Internal Medicine) [facility] (Ophthalmology)  Indicate any recent Medical Services you may have received from other  than [facility] providers in the past year (date may be approximate).     Assessment:   This is a routine wellness examination for Leiyah.  Hearing/Vision screen Hearing Screening - Comments:: No concerns. Vision Screening - Comments:: Patient wears glasses.  Dietary issues and exercise activities discussed: Current Exercise Habits: The patient does not participate in regular exercise at present   Goals Addressed   None   Depression Screen    10/18/2022    1:34 PM 08/20/2022    2:36 PM  PHQ 2/9 Scores  PHQ - 2 Score 0 0  PHQ- 9 Score 0 0    Fall Risk    10/18/2022    1:35 PM 10/18/2022   10:54 AM 08/20/2022    2:37 PM  [place] in the past year? 0 0 0  Number falls in past yr: 0 0 0  Injury with Fall? 0 0 0  Risk for fall due to : No Fall Risks  No Fall Risks  Follow up Falls evaluation completed  Falls evaluation completed    [place]:  Any stairs in or around the home? No  Home free of loose throw rugs in walkways, pet beds, electrical cords, etc? Yes  Adequate lighting in your home to reduce risk of falls? Yes   ASSISTIVE DEVICES UTILIZED TO PREVENT FALLS:  Life alert? No  Use of a cane, walker or w/c? No  Grab bars in the bathroom? No  Shower chair or bench in shower? No  Elevated toilet seat or a handicapped toilet? No   Cognitive Function:        10/18/2022     1:36 PM  6CIT Screen  What Year? 0 points  What month? 0 points  What time? 0 points  Count back from 20 0 points  Months in reverse 0 points  Repeat phrase 4 points  Total Score 4 points    Immunizations Immunization History  Administered Date(s) Administered   Fluad Quad(high Dose 65+) 08/20/2022    TDAP status: Due, Education has been provided regarding the importance of this vaccine. Advised may receive this vaccine at local pharmacy or Health Dept. Aware to provide a copy of the vaccination record if obtained from local pharmacy or Health Dept. Verbalized acceptance and understanding.  Flu Vaccine status: Up to date  Pneumococcal vaccine status: Up to date  Covid-19 vaccine status: Declined, Education has been provided regarding the importance of this vaccine but patient still declined. Advised may receive this vaccine at local pharmacy or Health Dept.or vaccine clinic. Aware to provide a copy of the vaccination record if obtained from local pharmacy or Health Dept. Verbalized acceptance and understanding.  Qualifies for Shingles Vaccine? Yes   Zostavax completed No   Shingrix Completed?: No.    Education has been provided regarding the importance of this vaccine. Patient has been advised to call insurance company to determine out of pocket expense if they have not yet received this vaccine. Advised may also receive vaccine at local pharmacy or Health Dept. Verbalized acceptance and understanding.  Screening Tests Health Maintenance  Topic Date Due   Diabetic kidney evaluation - Urine ACR  Never done   Hepatitis C Screening  Never done   TETANUS/TDAP  Never done   Pneumonia Vaccine 39+ Years old (1 - PCV) Never done   DEXA SCAN  Never done   Zoster Vaccines- Shingrix (1 of 2) 11/19/2022 (Originally 08/16/1965)   HEMOGLOBIN A1C  02/18/2023   OPHTHALMOLOGY EXAM  08/16/2023   Diabetic kidney evaluation - GFR measurement  08/21/2023   FOOT EXAM  08/21/2023   Medicare Annual  Wellness (AWV)  10/19/2023   INFLUENZA VACCINE  Completed   HPV VACCINES  Aged Out   COVID-19 Vaccine  Discontinued    Health Maintenance  Health Maintenance Due  Topic Date Due   Diabetic kidney evaluation - Urine ACR  Never done   Hepatitis C Screening  Never done   TETANUS/TDAP  Never done   Pneumonia Vaccine 72+ Years old (1 - PCV) Never done   DEXA SCAN  Never done    Colorectal cancer screening: No longer required.   Mammogram status: No longer required due to age.  Lung Cancer Screening: (Low Dose CT Chest recommended if Age 65-80 years, 30 pack-year currently smoking OR have quit w/in 15years.) does not qualify.   Additional Screening:  Hepatitis C Screening: does qualify; Overdue.  Vision Screening: Recommended annual ophthalmology exams for early detection of glaucoma and other disorders of the eye. Is the patient up to date with their annual eye exam?  Yes  Who is the provider or what is the name of the office in which the patient attends annual eye exams? Patient just moved here- [place] If pt is not established with a provider, would they like to be referred to a provider to establish care?  N/A .   Dental Screening: Recommended annual dental exams for proper oral hygiene  Community Resource Referral / Chronic Care Management: CRR required this visit?  No   CCM required this visit?  No      Plan:     I have personally reviewed and noted the following in the patient's chart:   Medical and social history Use of alcohol, tobacco or illicit drugs  Current medications and supplements including opioid prescriptions. Patient is not currently taking opioid prescriptions. Functional ability and status Nutritional status Physical activity Advanced directives List of other physicians Hospitalizations, surgeries, and ER visits in previous 12 months Vitals Screenings to include cognitive, depression, and falls Referrals and appointments  In  addition, I have reviewed and discussed with patient certain preventive protocols, quality metrics, and best practice recommendations. A written personalized care plan for preventive services as well as general preventive health recommendations were provided to patient.     Clista Bernhardt, [place]   10/18/2022    Ms. Bruney , Thank you for taking time to come for your Medicare Wellness Visit. I appreciate your ongoing commitment to your health goals. Please review the following plan we discussed and let me know if I can assist you in the future.   These are the goals we discussed:  Goals   None     This is a list of the screening recommended for you and due dates:  Health Maintenance  Topic Date Due   Yearly kidney health urinalysis for diabetes  Never done   Hepatitis C Screening: USPSTF Recommendation to screen - Ages 54-79 yo.  Never done   Tetanus Vaccine  Never done   Pneumonia Vaccine (1 - PCV) Never done   DEXA scan (bone density measurement)  Never done   Zoster (Shingles) Vaccine (1 of 2) 11/19/2022*   Hemoglobin A1C  02/18/2023   Eye exam for diabetics  08/16/2023   Yearly kidney function blood test for diabetes  08/21/2023   Complete foot exam   08/21/2023   Medicare Annual Wellness Visit  10/19/2023  Flu Shot  Completed   HPV Vaccine  Aged Out   COVID-19 Vaccine  Discontinued  *Topic was postponed. The date shown is not the original due date.     Nurse Notes: None.

## 2022-12-20 ENCOUNTER — Ambulatory Visit: Payer: Medicare Other | Admitting: Internal Medicine

## 2022-12-25 ENCOUNTER — Ambulatory Visit: Payer: Medicare Other | Admitting: Internal Medicine

## 2022-12-30 ENCOUNTER — Encounter: Payer: Self-pay | Admitting: Internal Medicine

## 2022-12-30 ENCOUNTER — Ambulatory Visit (INDEPENDENT_AMBULATORY_CARE_PROVIDER_SITE_OTHER): Payer: Medicare Other | Admitting: Internal Medicine

## 2022-12-30 VITALS — BP 136/72 | HR 68 | Ht 61.0 in | Wt 128.0 lb

## 2022-12-30 DIAGNOSIS — Z1159 Encounter for screening for other viral diseases: Secondary | ICD-10-CM | POA: Diagnosis not present

## 2022-12-30 DIAGNOSIS — E785 Hyperlipidemia, unspecified: Secondary | ICD-10-CM

## 2022-12-30 DIAGNOSIS — Z23 Encounter for immunization: Secondary | ICD-10-CM

## 2022-12-30 DIAGNOSIS — E1169 Type 2 diabetes mellitus with other specified complication: Secondary | ICD-10-CM

## 2022-12-30 DIAGNOSIS — E118 Type 2 diabetes mellitus with unspecified complications: Secondary | ICD-10-CM | POA: Diagnosis not present

## 2022-12-30 DIAGNOSIS — M7918 Myalgia, other site: Secondary | ICD-10-CM | POA: Insufficient documentation

## 2022-12-30 DIAGNOSIS — I10 Essential (primary) hypertension: Secondary | ICD-10-CM | POA: Diagnosis not present

## 2022-12-30 NOTE — Assessment & Plan Note (Addendum)
Has not been on metformin since 2022 Last A1C 6.6 on diet alone. Stable and weight.

## 2022-12-30 NOTE — Assessment & Plan Note (Signed)
Clinically stable exam with well controlled BP on lisinopril, hctz and coreg. Tolerating medications without side effects. Pt to continue current regimen and low sodium diet.

## 2022-12-30 NOTE — Progress Notes (Signed)
Date:  12/30/2022   Name:  Kimberly Robertson   DOB:  Jan 25, 1946   MRN:  [id number]   Chief Complaint: Hypertension and Diabetes  Hypertension This is a chronic problem. The problem is controlled. Pertinent negatives include no chest pain, headaches, palpitations or shortness of breath. Past treatments include beta blockers, ACE inhibitors and diuretics. There is no history of kidney disease, CAD/MI or CVA.  Diabetes She presents for her follow-up diabetic visit. She has type 2 diabetes mellitus. Her disease course has been stable. Pertinent negatives for hypoglycemia include no headaches or tremors. Pertinent negatives for diabetes include no chest pain, no fatigue, no polydipsia and no polyuria. Pertinent negatives for diabetic complications include no CVA.    Lab Results  Component Value Date   NA 140 08/20/2022   K 3.8 08/20/2022   CO2 26 08/20/2022   GLUCOSE 93 08/20/2022   BUN 14 08/20/2022   CREATININE 0.79 08/20/2022   CALCIUM 10.2 08/20/2022   EGFR 77 08/20/2022   Lab Results  Component Value Date   CHOL 184 08/20/2022   HDL 82 08/20/2022   LDLCALC 83 08/20/2022   TRIG 107 08/20/2022   CHOLHDL 2.2 08/20/2022   No results found for: "TSH" Lab Results  Component Value Date   HGBA1C 6.6 (H) 08/20/2022   Lab Results  Component Value Date   WBC 5.3 08/20/2022   HGB 14.4 08/20/2022   HCT 42.0 08/20/2022   MCV 91 08/20/2022   PLT 213 08/20/2022   Lab Results  Component Value Date   ALT 23 08/20/2022   AST 25 08/20/2022   ALKPHOS 89 08/20/2022   BILITOT 0.5 08/20/2022   No results found for: "25OHVITD2", "25OHVITD3", "VD25OH"   Review of Systems  Constitutional:  Negative for appetite change, fatigue, fever and unexpected weight change.  HENT:  Negative for tinnitus and trouble swallowing.   Eyes:  Negative for visual disturbance.  Respiratory:  Negative for cough, chest tightness and shortness of breath.   Cardiovascular:  Negative for chest pain,  palpitations and leg swelling.  Gastrointestinal:  Negative for abdominal pain.  Endocrine: Negative for polydipsia and polyuria.  Genitourinary:  Negative for dysuria and hematuria.  Musculoskeletal:  Positive for gait problem (foot pain) and myalgias. Negative for arthralgias.  Neurological:  Negative for tremors, numbness and headaches.  Psychiatric/Behavioral:  Negative for dysphoric mood.     Patient Active Problem List   Diagnosis Date Noted   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/14/2022   Essential hypertension 08/20/2022   Type II diabetes mellitus with complication ([place]) 58/85/0277   Hyperlipidemia associated with type 2 diabetes mellitus ([place]) 08/20/2022   Mild intermittent asthma without complication 41/28/7867   History of endometrial cancer 08/20/2022    Allergies  Allergen Reactions   Clarithromycin Other (See Comments)    Past Surgical History:  Procedure Laterality Date   ABDOMINAL HYSTERECTOMY  2018   BLADDER SURGERY  2000   and 2018   BREAST BIOPSY Left    2004-benign    Social History   Tobacco Use   Smoking status: Never   Smokeless tobacco: Never  Vaping Use   Vaping Use: Never used  Substance Use Topics   Alcohol use: Not Currently   Drug use: Never     Medication list has been reviewed and updated.  Current Meds  Medication Sig   ASPIRIN 81 PO aspirin   b complex vitamins capsule Take 1 capsule by mouth daily.   benazepril (LOTENSIN) 40 MG tablet  Take 1 tablet (40 mg total) by mouth daily.   calcium carbonate (OS-CAL) 600 MG tablet Take 600 mg by mouth 2 (two) times daily with a meal.   carvedilol (COREG) 12.5 MG tablet Take 1 tablet (12.5 mg total) by mouth 2 (two) times daily with a meal.   hydrochlorothiazide (HYDRODIURIL) 25 MG tablet Take 1 tablet every day by oral route.   montelukast (SINGULAIR) 10 MG tablet Take 1 tablet every day by oral route.   multivitamin-lutein (OCUVITE-LUTEIN) CAPS capsule Take 1 capsule by mouth daily.    potassium chloride (KLOR-CON) 10 MEQ tablet Take 1 tablet (10 mEq total) by mouth daily.   rosuvastatin (CRESTOR) 5 MG tablet Take 1 tablet (5 mg total) by mouth daily.   UNABLE TO FIND in the morning and at bedtime. Med Name: Sciatic Ease   [DISCONTINUED] lidocaine (LIDODERM) 5 % Place 1 patch onto the skin every 12 (twelve) hours. Remove & Discard patch within 12 hours or as directed by MD       12/30/2022    1:33 PM 08/20/2022    2:37 PM  GAD 7 : Generalized Anxiety Score  Nervous, Anxious, on Edge 0 0  Control/stop worrying 0 2  Worry too much - different things 0 0  Trouble relaxing 0 0  Restless 0 0  Easily annoyed or irritable 0 0  Afraid - awful might happen 0 0  Total GAD 7 Score 0 2  Anxiety Difficulty Not difficult at all Not difficult at all       12/30/2022    1:33 PM 10/18/2022    1:34 PM 08/20/2022    2:36 PM  Depression screen PHQ 2/9  Decreased Interest 0 0 0  Down, Depressed, Hopeless 0 0 0  PHQ - 2 Score 0 0 0  Altered sleeping 0 0 0  Tired, decreased energy 0 0 0  Change in appetite 0 0 0  Feeling bad or failure about yourself  0 0 0  Trouble concentrating 0 0 0  Moving slowly or fidgety/restless 0 0 0  Suicidal thoughts 0 0 0  PHQ-9 Score 0 0 0  Difficult doing work/chores Not difficult at all Not difficult at all Not difficult at all    BP Readings from Last 3 Encounters:  12/30/22 136/72  10/14/22 128/80  08/20/22 138/70    Physical Exam Vitals and nursing note reviewed.  Constitutional:      General: She is not in acute distress.    Appearance: She is well-developed.  HENT:     Head: Normocephalic and atraumatic.  Neck:     Vascular: No carotid bruit.  Cardiovascular:     Rate and Rhythm: Normal rate.     Pulses: Normal pulses.  Pulmonary:     Effort: Pulmonary effort is normal. No respiratory distress.     Breath sounds: No wheezing or rhonchi.  Musculoskeletal:     Cervical back: Normal range of motion.     Right lower leg: No  edema.     Left lower leg: No edema.  Lymphadenopathy:     Cervical: No cervical adenopathy.  Skin:    General: Skin is warm and dry.     Findings: No rash.  Neurological:     Mental Status: She is alert and oriented to person, place, and time.  Psychiatric:        Mood and Affect: Mood normal.        Behavior: Behavior normal.     Wt Readings  from Last 3 Encounters:  12/30/22 128 lb (58.1 kg)  10/18/22 129 lb (58.5 kg)  10/14/22 129 lb (58.5 kg)    BP 136/72 (BP Location: Left Arm, Cuff Size: Normal)   Pulse 68   Ht '5\' 1"'$  (1.549 m)   Wt 128 lb (58.1 kg)   SpO2 96%   BMI 24.19 kg/m   Assessment and Plan: Problem List Items Addressed This Visit       Cardiovascular and Mediastinum   Essential hypertension (Chronic)    Clinically stable exam with well controlled BP on lisinopril, hctz and coreg. Tolerating medications without side effects. Pt to continue current regimen and low sodium diet.       Relevant Orders   Basic metabolic panel     Endocrine   Hyperlipidemia associated with type 2 diabetes mellitus (HCC) (Chronic)   Type II diabetes mellitus with complication ([place]) - Primary    Has not been on metformin since 2022 Last A1C 6.6 on diet alone      Relevant Orders   Hemoglobin P2R   Basic metabolic panel   Microalbumin / creatinine urine ratio   Other Visit Diagnoses     Need for hepatitis C screening test       Relevant Orders   Hepatitis C antibody   Need for vaccination for pneumococcus       Relevant Orders   Pneumococcal conjugate vaccine 20-valent   Myalgia, lower leg       could be due to Crestor I recommend reducing the dose to MWF        Partially dictated using Editor, commissioning. Any errors are unintentional.  Halina Maidens, MD [facility]  12/30/2022

## 2022-12-31 LAB — BASIC METABOLIC PANEL
BUN/Creatinine Ratio: 14 (ref 12–28)
BUN: 17 mg/dL (ref 8–27)
CO2: 24 mmol/L (ref 20–29)
Calcium: 9.9 mg/dL (ref 8.7–10.3)
Chloride: 96 mmol/L (ref 96–106)
Creatinine, Ser: 1.2 mg/dL — ABNORMAL HIGH (ref 0.57–1.00)
Glucose: 203 mg/dL — ABNORMAL HIGH (ref 70–99)
Potassium: 3.9 mmol/L (ref 3.5–5.2)
Sodium: 135 mmol/L (ref 134–144)
eGFR: 46 mL/min/{1.73_m2} — ABNORMAL LOW (ref >59)

## 2022-12-31 LAB — HEPATITIS C ANTIBODY: Hep C Virus Ab: NONREACTIVE

## 2022-12-31 LAB — MICROALBUMIN / CREATININE URINE RATIO
Creatinine, Urine: 91.4 mg/dL
Microalb/Creat Ratio: 7 mg/g creat (ref 0–29)
Microalbumin, Urine: 6.5 ug/mL

## 2022-12-31 LAB — HEMOGLOBIN A1C
Est. average glucose Bld gHb Est-mCnc: 148 mg/dL
Hgb A1c MFr Bld: 6.8 % — ABNORMAL HIGH (ref 4.8–5.6)

## 2023-01-29 ENCOUNTER — Ambulatory Visit (INDEPENDENT_AMBULATORY_CARE_PROVIDER_SITE_OTHER): Payer: Medicare Other

## 2023-01-29 VITALS — Ht 61.0 in | Wt 128.0 lb

## 2023-01-29 DIAGNOSIS — Z780 Asymptomatic menopausal state: Secondary | ICD-10-CM

## 2023-01-29 DIAGNOSIS — Z0000 Encounter for general adult medical examination without abnormal findings: Secondary | ICD-10-CM | POA: Diagnosis not present

## 2023-01-29 NOTE — Progress Notes (Signed)
I connected with  Kimberly Robertson on 01/29/23 by a audio enabled telemedicine application and verified that I am speaking with the correct person using two identifiers.  Patient Location: Home  Provider Location: Office/Clinic  I discussed the limitations of evaluation and management by telemedicine. The patient expressed understanding and agreed to proceed.  Subjective:   Kimberly Robertson is a 77 y.o. female who presents for Medicare Annual (Subsequent) preventive examination.  Review of Systems     Cardiac Risk Factors include: advanced age (>9mn, >>54women)     Objective:    There were no vitals filed for this visit. There is no height or weight on file to calculate BMI.     01/29/2023   12:16 PM 10/18/2022    1:34 PM  Advanced Directives  Does Patient Have a Medical Advance Directive? No No  Would patient like information on creating a medical advance directive? No - Patient declined No - Guardian declined    Current Medications (verified) Outpatient Encounter Medications as of 01/29/2023  Medication Sig   ASPIRIN 81 PO aspirin   b complex vitamins capsule Take 1 capsule by mouth daily.   benazepril (LOTENSIN) 40 MG tablet Take 1 tablet (40 mg total) by mouth daily.   calcium carbonate (OS-CAL) 600 MG tablet Take 600 mg by mouth 2 (two) times daily with a meal.   carvedilol (COREG) 12.5 MG tablet Take 1 tablet (12.5 mg total) by mouth 2 (two) times daily with a meal.   hydrochlorothiazide (HYDRODIURIL) 25 MG tablet Take 1 tablet every day by oral route.   montelukast (SINGULAIR) 10 MG tablet Take 1 tablet every day by oral route.   multivitamin-lutein (OCUVITE-LUTEIN) CAPS capsule Take 1 capsule by mouth daily.   potassium chloride (KLOR-CON) 10 MEQ tablet Take 1 tablet (10 mEq total) by mouth daily.   rosuvastatin (CRESTOR) 5 MG tablet Take 1 tablet (5 mg total) by mouth daily.   UNABLE TO FIND in the morning and at bedtime. Med Name: Sciatic Ease (Patient not taking:  Reported on 01/29/2023)   No facility-administered encounter medications on file as of 01/29/2023.    Allergies (verified) Clarithromycin   History: Past Medical History:  Diagnosis Date   Asthma    Cancer ([place] 08/2018   endometrial   Depression    Diabetes mellitus without complication ([place]    Hyperlipidemia    Hypertension    Past Surgical History:  Procedure Laterality Date   ABDOMINAL HYSTERECTOMY  2018   BLADDER SURGERY  2000   and 2018   BREAST BIOPSY Left    2004-benign   Family History  Problem Relation Age of Onset   Cancer Father    Heart attack Father    Diabetes Maternal Aunt    Cancer Maternal Aunt    Diabetes Maternal Uncle    Cancer Maternal Uncle    Breast cancer Paternal Aunt    Cancer Paternal Uncle    Social History   Socioeconomic History   Marital status: Married    Spouse name: GGeophysical data Robertson  Number of children: 2   Years of education: Not on file   Highest education level: Not on file  Occupational History   Not on file  Tobacco Use   Smoking status: Never   Smokeless tobacco: Never  Vaping Use   Vaping Use: Never used  Substance and Sexual Activity   Alcohol use: Not Currently   Drug use: Never   Sexual activity: Not Currently  Other Topics  Concern   Not on file  Social History Narrative   Not on file   Social Determinants of Health   Financial Resource Strain: Low Risk  (01/29/2023)   Overall Financial Resource Strain (CARDIA)    Difficulty of Paying Living Expenses: Not hard at all  Recent Concern: Financial Resource Strain - High Risk (12/30/2022)   Overall Financial Resource Strain (CARDIA)    Difficulty of Paying Living Expenses: Hard  Food Insecurity: No Food Insecurity (01/29/2023)   Hunger Vital Sign    Worried About Running Out of Food in the Last Year: Never true    Ran Out of Food in the Last Year: Never true  Transportation Needs: No Transportation Needs (01/29/2023)   PRAPARE - Civil engineer, contracting (Medical): No    Lack of Transportation (Non-Medical): No  Physical Activity: Insufficiently Active (01/29/2023)   Exercise Vital Sign    Days of Exercise per Week: 3 days    Minutes of Exercise per Session: 30 min  Stress: No Stress Concern Present (01/29/2023)   [place]    Feeling of Stress : Only a little  Social Connections: Moderately Isolated (01/29/2023)   Social Connection and Isolation Panel [NHANES]    Frequency of Communication with Friends and Family: More than three times a week    Frequency of Social Gatherings with Friends and Family: Three times a week    Attends Religious Services: Never    Active Member of Clubs or Organizations: No    Attends Archivist Meetings: Never    Marital Status: Married    Tobacco Counseling Counseling given: Not Answered   Clinical Intake:  Pre-visit preparation completed: Yes  Pain : No/denies pain     Nutritional Risks: None Diabetes: Yes CBG done?: No Did pt. bring in CBG monitor from home?: No  How often do you need to have someone help you when you read instructions, pamphlets, or other written materials from your doctor or pharmacy?: 1 - Never  Diabetic?no  Interpreter Needed?: No  Information entered by :: Kimberly Shaggy, LPN   Activities of Daily Living    01/29/2023   12:17 PM 01/25/2023   12:25 PM  In your present state of health, do you have any difficulty performing the following activities:  Hearing? 0 0  Vision? 0 0  Difficulty concentrating or making decisions? 0 0  Walking or climbing stairs? 0 0  Dressing or bathing? 0 0  Doing errands, shopping? 0 0  Preparing Food and eating ? N N  Using the Toilet? N N  In the past six months, have you accidently leaked urine? N N  Do you have problems with loss of bowel control? N N  Managing your Medications? N N  Managing your Finances? N N  Housekeeping or  managing your Housekeeping? N N    Patient Care Team: Kimberly Hess, MD as PCP - General (Internal Medicine) [facility] (Ophthalmology)  Indicate any recent Medical Services you may have received from other than [facility] providers in the past year (date may be approximate).     Assessment:   This is a routine wellness examination for Kimberly Robertson.  Hearing/Vision screen Hearing Screening - Comments:: No aids Vision Screening - Comments:: Wears glasses- Dr.Nice  Dietary issues and exercise activities discussed: Current Exercise Habits: Home exercise routine, Type of exercise: walking, Time (Minutes): 30, Frequency (Times/Week): 3, Weekly Exercise (Minutes/Week): 90, Intensity: Mild  Goals Addressed             This Visit's Progress    DIET - EAT MORE FRUITS AND VEGETABLES         Depression Screen    01/29/2023   12:15 PM 12/30/2022    1:33 PM 10/18/2022    1:34 PM 08/20/2022    2:36 PM  PHQ 2/9 Scores  PHQ - 2 Score 0 0 0 0  PHQ- 9 Score 0 0 0 0    Fall Risk    01/29/2023   12:17 PM 01/25/2023   12:25 PM 12/30/2022    1:34 PM 10/18/2022    1:35 PM 10/18/2022   10:54 AM  Fall Risk   Falls in the past year? 0 0 0 0 0  Number falls in past yr: 0 0 0 0 0  Injury with Fall? 0 0 0 0 0  Risk for fall due to : No Fall Risks  No Fall Risks No Fall Risks   Follow up Falls prevention discussed;Falls evaluation completed  Falls evaluation completed Falls evaluation completed     FALL RISK PREVENTION PERTAINING TO THE HOME:  Any stairs in or around the home? Yes  If so, are there any without handrails? No  Home free of loose throw rugs in walkways, pet beds, electrical cords, etc? Yes  Adequate lighting in your home to reduce risk of falls? Yes   ASSISTIVE DEVICES UTILIZED TO PREVENT FALLS:  Life alert? No  Use of a cane, walker or w/c? No  Grab bars in the bathroom? No  Shower chair or bench in shower? No  Elevated toilet seat or a handicapped toilet? No    Cognitive Function:        01/29/2023   12:21 PM 10/18/2022    1:36 PM  6CIT Screen  What Year? 0 points 0 points  What month? 0 points 0 points  What time? 0 points 0 points  Count back from 20 0 points 0 points  Months in reverse 0 points 0 points  Repeat phrase 0 points 4 points  Total Score 0 points 4 points    Immunizations Immunization History  Administered Date(s) Administered   Fluad Quad(high Dose 65+) 08/20/2022   PNEUMOCOCCAL CONJUGATE-20 12/30/2022    TDAP status: Due, Education has been provided regarding the importance of this vaccine. Advised may receive this vaccine at local pharmacy or Health Dept. Aware to provide a copy of the vaccination record if obtained from local pharmacy or Health Dept. Verbalized acceptance and understanding.  Flu Vaccine status: Up to date  Pneumococcal vaccine status: Up to date  Covid-19 vaccine status: Declined, Education has been provided regarding the importance of this vaccine but patient still declined. Advised may receive this vaccine at local pharmacy or Health Dept.or vaccine clinic. Aware to provide a copy of the vaccination record if obtained from local pharmacy or Health Dept. Verbalized acceptance and understanding.  Qualifies for Shingles Vaccine? Yes   Zostavax completed No   Shingrix Completed?: No.    Education has been provided regarding the importance of this vaccine. Patient has been advised to call insurance company to determine out of pocket expense if they have not yet received this vaccine. Advised may also receive vaccine at local pharmacy or Health Dept. Verbalized acceptance and understanding.  Screening Tests Health Maintenance  Topic Date Due   DTaP/Tdap/Td (1 - Tdap) Never done   Zoster Vaccines- Shingrix (1 of 2) Never done   DEXA SCAN  Never done   HEMOGLOBIN A1C  06/30/2023   OPHTHALMOLOGY EXAM  08/16/2023   FOOT EXAM  08/21/2023   Diabetic kidney evaluation - eGFR measurement  12/31/2023    Diabetic kidney evaluation - Urine ACR  12/31/2023   Medicare Annual Wellness (AWV)  01/30/2024   Pneumonia Vaccine 55+ Years old  Completed   INFLUENZA VACCINE  Completed   Hepatitis C Screening  Completed   HPV VACCINES  Aged Out   COVID-19 Vaccine  Discontinued    Health Maintenance  Health Maintenance Due  Topic Date Due   DTaP/Tdap/Td (1 - Tdap) Never done   Zoster Vaccines- Shingrix (1 of 2) Never done   DEXA SCAN  Never done    Colorectal cancer screening: No longer required.   Mammogram status: No longer required due to age- daughter does her mammogram at [facility] every year.  Bone Density status: Ordered 01/29/23. Pt provided with contact info and advised to call to schedule appt.  Lung Cancer Screening: (Low Dose CT Chest recommended if Age 65-80 years, 30 pack-year currently smoking OR have quit w/in 15years.) does not qualify.   Additional Screening:  Hepatitis C Screening: does qualify; Completed 12/30/22  Vision Screening: Recommended annual ophthalmology exams for early detection of glaucoma and other disorders of the eye. Is the patient up to date with their annual eye exam?  Yes  Who is the provider or what is the name of the office in which the patient attends annual eye exams? Dr.Nice If pt is not established with a provider, would they like to be referred to a provider to establish care? No .   Dental Screening: Recommended annual dental exams for proper oral hygiene  Community Resource Referral / Chronic Care Management: CRR required this visit?  No   CCM required this visit?  No      Plan:     I have personally reviewed and noted the following in the patient's chart:   Medical and social history Use of alcohol, tobacco or illicit drugs  Current medications and supplements including opioid prescriptions. Patient is not currently taking opioid prescriptions. Functional ability and status Nutritional status Physical activity Advanced  directives List of other physicians Hospitalizations, surgeries, and ER visits in previous 12 months Vitals Screenings to include cognitive, depression, and falls Referrals and appointments  In addition, I have reviewed and discussed with patient certain preventive protocols, quality metrics, and best practice recommendations. A written personalized care plan for preventive services as well as general preventive health recommendations were provided to patient.     Dionisio David, LPN   [id number]   Nurse Notes: none

## 2023-01-29 NOTE — Patient Instructions (Signed)
Ms. Schirmer , Thank you for taking time to come for your Medicare Wellness Visit. I appreciate your ongoing commitment to your health goals. Please review the following plan we discussed and let me know if I can assist you in the future.   These are the goals we discussed:  Goals      DIET - EAT MORE FRUITS AND VEGETABLES        This is a list of the screening recommended for you and due dates:  Health Maintenance  Topic Date Due   DTaP/Tdap/Td vaccine (1 - Tdap) Never done   Zoster (Shingles) Vaccine (1 of 2) Never done   DEXA scan (bone density measurement)  Never done   Hemoglobin A1C  06/30/2023   Eye exam for diabetics  08/16/2023   Complete foot exam   08/21/2023   Yearly kidney function blood test for diabetes  12/31/2023   Yearly kidney health urinalysis for diabetes  12/31/2023   Medicare Annual Wellness Visit  01/30/2024   Pneumonia Vaccine  Completed   Flu Shot  Completed   Hepatitis C Screening: USPSTF Recommendation to screen - Ages 18-79 yo.  Completed   HPV Vaccine  Aged Out   COVID-19 Vaccine  Discontinued    Advanced directives: no  Conditions/risks identified: none  Next appointment: Follow up in one year for your annual wellness visit 02/04/24 @ 10:45 am by phone   Preventive Care 65 Years and Older, Female Preventive care refers to lifestyle choices and visits with your health care provider that can promote health and wellness. What does preventive care include? A yearly physical exam. This is also called an annual well check. Dental exams once or twice a year. Routine eye exams. Ask your health care provider how often you should have your eyes checked. Personal lifestyle choices, including: Daily care of your teeth and gums. Regular physical activity. Eating a healthy diet. Avoiding tobacco and drug use. Limiting alcohol use. Practicing safe sex. Taking low-dose aspirin every day. Taking vitamin and mineral supplements as recommended by your  health care provider. What happens during an annual well check? The services and screenings done by your health care provider during your annual well check will depend on your age, overall health, lifestyle risk factors, and family history of disease. Counseling  Your health care provider may ask you questions about your: Alcohol use. Tobacco use. Drug use. Emotional well-being. Home and relationship well-being. Sexual activity. Eating habits. History of falls. Memory and ability to understand (cognition). Work and work Statistician. Reproductive health. Screening  You may have the following tests or measurements: Height, weight, and BMI. Blood pressure. Lipid and cholesterol levels. These may be checked every 5 years, or more frequently if you are over 53 years old. Skin check. Lung cancer screening. You may have this screening every year starting at age 32 if you have a 30-pack-year history of smoking and currently smoke or have quit within the past 15 years. Fecal occult blood test (FOBT) of the stool. You may have this test every year starting at age 55. Flexible sigmoidoscopy or colonoscopy. You may have a sigmoidoscopy every 5 years or a colonoscopy every 10 years starting at age 70. Hepatitis C blood test. Hepatitis B blood test. Sexually transmitted disease (STD) testing. Diabetes screening. This is done by checking your blood sugar (glucose) after you have not eaten for a while (fasting). You may have this done every 1-3 years. Bone density scan. This is done to screen for osteoporosis.  You may have this done starting at age 53. Mammogram. This may be done every 1-2 years. Talk to your health care provider about how often you should have regular mammograms. Talk with your health care provider about your test results, treatment options, and if necessary, the need for more tests. Vaccines  Your health care provider may recommend certain vaccines, such as: Influenza vaccine.  This is recommended every year. Tetanus, diphtheria, and acellular pertussis (Tdap, Td) vaccine. You may need a Td booster every 10 years. Zoster vaccine. You may need this after age 66. Pneumococcal 13-valent conjugate (PCV13) vaccine. One dose is recommended after age 84. Pneumococcal polysaccharide (PPSV23) vaccine. One dose is recommended after age 51. Talk to your health care provider about which screenings and vaccines you need and how often you need them. This information is not intended to replace advice given to you by your health care provider. Make sure you discuss any questions you have with your health care provider. Document Released: 12/22/2015 Document Revised: 08/14/2016 Document Reviewed: 09/26/2015 Elsevier Interactive Patient Education  2017 [place] Prevention in the Home Falls can cause injuries. They can happen to people of all ages. There are many things you can do to make your home safe and to help prevent falls. What can I do on the outside of my home? Regularly fix the edges of walkways and driveways and fix any cracks. Remove anything that might make you trip as you walk through a door, such as a raised step or threshold. Trim any bushes or trees on the path to your home. Use bright outdoor lighting. Clear any walking paths of anything that might make someone trip, such as rocks or tools. Regularly check to see if handrails are loose or broken. Make sure that both sides of any steps have handrails. Any raised decks and porches should have guardrails on the edges. Have any leaves, snow, or ice cleared regularly. Use sand or salt on walking paths during winter. Clean up any spills in your garage right away. This includes oil or grease spills. What can I do in the bathroom? Use night lights. Install grab bars by the toilet and in the tub and shower. Do not use towel bars as grab bars. Use non-skid mats or decals in the tub or shower. If you need to sit  down in the shower, use a plastic, non-slip stool. Keep the floor dry. Clean up any water that spills on the floor as soon as it happens. Remove soap buildup in the tub or shower regularly. Attach bath mats securely with double-sided non-slip rug tape. Do not have throw rugs and other things on the floor that can make you trip. What can I do in the bedroom? Use night lights. Make sure that you have a light by your bed that is easy to reach. Do not use any sheets or blankets that are too big for your bed. They should not hang down onto the floor. Have a firm chair that has side arms. You can use this for support while you get dressed. Do not have throw rugs and other things on the floor that can make you trip. What can I do in the kitchen? Clean up any spills right away. Avoid walking on wet floors. Keep items that you use a lot in easy-to-reach places. If you need to reach something above you, use a strong step stool that has a grab bar. Keep electrical cords out of the way. Do not use  floor polish or wax that makes floors slippery. If you must use wax, use non-skid floor wax. Do not have throw rugs and other things on the floor that can make you trip. What can I do with my stairs? Do not leave any items on the stairs. Make sure that there are handrails on both sides of the stairs and use them. Fix handrails that are broken or loose. Make sure that handrails are as long as the stairways. Check any carpeting to make sure that it is firmly attached to the stairs. Fix any carpet that is loose or worn. Avoid having throw rugs at the top or bottom of the stairs. If you do have throw rugs, attach them to the floor with carpet tape. Make sure that you have a light switch at the top of the stairs and the bottom of the stairs. If you do not have them, ask someone to add them for you. What else can I do to help prevent falls? Wear shoes that: Do not have high heels. Have rubber bottoms. Are  comfortable and fit you well. Are closed at the toe. Do not wear sandals. If you use a stepladder: Make sure that it is fully opened. Do not climb a closed stepladder. Make sure that both sides of the stepladder are locked into place. Ask someone to hold it for you, if possible. Clearly mark and make sure that you can see: Any grab bars or handrails. First and last steps. Where the edge of each step is. Use tools that help you move around (mobility aids) if they are needed. These include: Canes. Walkers. Scooters. Crutches. Turn on the lights when you go into a dark area. Replace any light bulbs as soon as they burn out. Set up your furniture so you have a clear path. Avoid moving your furniture around. If any of your floors are uneven, fix them. If there are any pets around you, be aware of where they are. Review your medicines with your doctor. Some medicines can make you feel dizzy. This can increase your chance of falling. Ask your doctor what other things that you can do to help prevent falls. This information is not intended to replace advice given to you by your health care provider. Make sure you discuss any questions you have with your health care provider. Document Released: 09/21/2009 Document Revised: 05/02/2016 Document Reviewed: 12/30/2014 Elsevier Interactive Patient Education  2017 [employer].

## 2023-03-26 ENCOUNTER — Other Ambulatory Visit: Payer: Self-pay | Admitting: Internal Medicine

## 2023-03-26 DIAGNOSIS — J4520 Mild intermittent asthma, uncomplicated: Secondary | ICD-10-CM

## 2023-03-26 DIAGNOSIS — I10 Essential (primary) hypertension: Secondary | ICD-10-CM

## 2023-03-27 NOTE — Telephone Encounter (Signed)
Requested Prescriptions  Pending Prescriptions Disp Refills   montelukast (SINGULAIR) 10 MG tablet [Pharmacy Med Name: MONTELUKAST SOD 10 MG TABLET] 90 tablet 0    Sig: Take 1 tablet by mouth every day.     Pulmonology:  Leukotriene Inhibitors Passed - 03/26/2023  8:10 PM      Passed - Valid encounter within last 12 months    Recent Outpatient Visits           2 months ago Type II diabetes mellitus with complication [facility])   [place] [facility] at [facility], Nyoka Cowden, MD   5 months ago Osteoarthritis of left foot, unspecified osteoarthritis type   [facility] at [facility], Ocie Bob, MD   7 months ago Essential hypertension   [place] [facility] at [facility], Nyoka Cowden, MD       Future Appointments             In 1 month Judithann Graves Nyoka Cowden, MD [facility] at [facility], [facility]             potassium chloride (KLOR-CON) 10 MEQ tablet [Pharmacy Med Name: POTASSIUM CL ER 10 MEQ TABLET] 90 tablet 0    Sig: Take 1 tablet (10 mEq total) by mouth daily.     Endocrinology:  Minerals - Potassium Supplementation Failed - 03/26/2023  8:10 PM      Failed - Cr in normal range and within 360 days    Creatinine, Ser  Date Value Ref Range Status  12/30/2022 1.20 (H) 0.57 - 1.00 mg/dL Final         Passed - K in normal range and within 360 days    Potassium  Date Value Ref Range Status  12/30/2022 3.9 3.5 - 5.2 mmol/L Final         Passed - Valid encounter within last 12 months    Recent Outpatient Visits           2 months ago Type II diabetes mellitus with complication [facility])   [place] [facility] at [facility], Nyoka Cowden, MD   5 months ago Osteoarthritis of left foot, unspecified osteoarthritis type   [facility] at [facility], Ocie Bob, MD   7 months  ago Essential hypertension   [facility] at [facility], Nyoka Cowden, MD       Future Appointments             In 1 month Judithann Graves, Nyoka Cowden, MD [facility] at [facility], [facility]

## 2023-03-30 ENCOUNTER — Other Ambulatory Visit: Payer: Self-pay | Admitting: Internal Medicine

## 2023-03-30 DIAGNOSIS — I10 Essential (primary) hypertension: Secondary | ICD-10-CM

## 2023-03-30 DIAGNOSIS — E1169 Type 2 diabetes mellitus with other specified complication: Secondary | ICD-10-CM

## 2023-04-09 ENCOUNTER — Other Ambulatory Visit: Payer: Self-pay | Admitting: Internal Medicine

## 2023-04-09 DIAGNOSIS — I10 Essential (primary) hypertension: Secondary | ICD-10-CM

## 2023-04-30 ENCOUNTER — Ambulatory Visit (INDEPENDENT_AMBULATORY_CARE_PROVIDER_SITE_OTHER): Payer: Medicare Other | Admitting: Internal Medicine

## 2023-04-30 ENCOUNTER — Encounter: Payer: Self-pay | Admitting: Internal Medicine

## 2023-04-30 VITALS — BP 126/76 | HR 85 | Ht 61.0 in | Wt 128.2 lb

## 2023-04-30 DIAGNOSIS — E1169 Type 2 diabetes mellitus with other specified complication: Secondary | ICD-10-CM | POA: Diagnosis not present

## 2023-04-30 DIAGNOSIS — R8290 Unspecified abnormal findings in urine: Secondary | ICD-10-CM

## 2023-04-30 DIAGNOSIS — J4520 Mild intermittent asthma, uncomplicated: Secondary | ICD-10-CM

## 2023-04-30 DIAGNOSIS — E118 Type 2 diabetes mellitus with unspecified complications: Secondary | ICD-10-CM | POA: Diagnosis not present

## 2023-04-30 DIAGNOSIS — I10 Essential (primary) hypertension: Secondary | ICD-10-CM

## 2023-04-30 DIAGNOSIS — E785 Hyperlipidemia, unspecified: Secondary | ICD-10-CM

## 2023-04-30 DIAGNOSIS — Z780 Asymptomatic menopausal state: Secondary | ICD-10-CM

## 2023-04-30 LAB — POCT URINALYSIS DIPSTICK
Bilirubin, UA: NEGATIVE
Blood, UA: NEGATIVE
Glucose, UA: NEGATIVE
Ketones, UA: NEGATIVE
Nitrite, UA: POSITIVE
Protein, UA: NEGATIVE
Spec Grav, UA: 1.025 (ref 1.010–1.025)
Urobilinogen, UA: 0.2 E.U./dL
pH, UA: 5 (ref 5.0–8.0)

## 2023-04-30 MED ORDER — MONTELUKAST SODIUM 10 MG PO TABS
ORAL_TABLET | ORAL | 3 refills | Status: AC
Start: 2023-04-30 — End: ?

## 2023-04-30 MED ORDER — ROSUVASTATIN CALCIUM 5 MG PO TABS: 5.0000 mg | ORAL_TABLET | ORAL | 0 refills | Status: DC

## 2023-04-30 MED ORDER — FLUTICASONE-SALMETEROL 100-50 MCG/ACT IN AEPB
1.0000 | INHALATION_SPRAY | Freq: Two times a day (BID) | RESPIRATORY_TRACT | 0 refills | Status: DC
Start: 2023-04-30 — End: 2023-10-27

## 2023-04-30 NOTE — Assessment & Plan Note (Addendum)
Tolerating statin medications better since reducing the dose to three times per week.  No side effects noted. LDL is  Lab Results  Component Value Date   LDLCALC 83 08/20/2022  On Crestor 5 mg MWF

## 2023-04-30 NOTE — Assessment & Plan Note (Signed)
She has been without Advair or albuterol due to cost (does not have part D) Will send in Wixela to see if is affordable. She will request albuterol neb solution when needed

## 2023-04-30 NOTE — Patient Instructions (Signed)
Call [facility] to schedule your Bone density test at [phone].

## 2023-04-30 NOTE — Assessment & Plan Note (Signed)
Blood sugars stable without hypoglycemic symptoms or events. Currently being treated with diet alone. Lab Results  Component Value Date   HGBA1C 6.8 (H) 12/30/2022

## 2023-04-30 NOTE — Progress Notes (Signed)
Date:  04/30/2023   Name:  Kimberly Robertson   DOB:  Apr 07, 1946   MRN:  [id number]   Chief Complaint: Annual Exam Kimberly Robertson is a 77 y.o. female who presents today for her Complete Annual Exam. She feels well. She reports exercising. She reports she is sleeping well. Breast complaints - none.  Mammogram: 02/2023 DEXA: ordered but not scheduled Colonoscopy: none  Health Maintenance Due  Topic Date Due   DTaP/Tdap/Td (1 - Tdap) Never done   Zoster Vaccines- Shingrix (1 of 2) Never done   DEXA SCAN  Never done    Immunization History  Administered Date(s) Administered   Fluad Quad(high Dose 65+) 08/20/2022   PNEUMOCOCCAL CONJUGATE-20 12/30/2022    Hypertension This is a chronic problem. The problem is controlled. Pertinent negatives include no chest pain, headaches, palpitations or shortness of breath. Past treatments include beta blockers, ACE inhibitors and diuretics. The current treatment provides significant improvement.  Diabetes She presents for her follow-up diabetic visit. She has type 2 diabetes mellitus. Her disease course has been stable. Pertinent negatives for hypoglycemia include no dizziness, headaches, nervousness/anxiousness or tremors. Pertinent negatives for diabetes include no chest pain, no fatigue, no polydipsia and no polyuria. Current diabetic treatment includes diet. She is compliant with treatment most of the time.  Hyperlipidemia This is a chronic problem. The problem is controlled. Pertinent negatives include no chest pain or shortness of breath. Current antihyperlipidemic treatment includes statins. The current treatment provides significant improvement of lipids.    Lab Results  Component Value Date   NA 135 12/30/2022   K 3.9 12/30/2022   CO2 24 12/30/2022   GLUCOSE 203 (H) 12/30/2022   BUN 17 12/30/2022   CREATININE 1.20 (H) 12/30/2022   CALCIUM 9.9 12/30/2022   EGFR 46 (L) 12/30/2022   Lab Results  Component Value Date   CHOL 184  08/20/2022   HDL 82 08/20/2022   LDLCALC 83 08/20/2022   TRIG 107 08/20/2022   CHOLHDL 2.2 08/20/2022   No results found for: "TSH" Lab Results  Component Value Date   HGBA1C 6.8 (H) 12/30/2022   Lab Results  Component Value Date   WBC 5.3 08/20/2022   HGB 14.4 08/20/2022   HCT 42.0 08/20/2022   MCV 91 08/20/2022   PLT 213 08/20/2022   Lab Results  Component Value Date   ALT 23 08/20/2022   AST 25 08/20/2022   ALKPHOS 89 08/20/2022   BILITOT 0.5 08/20/2022   No results found for: "25OHVITD2", "25OHVITD3", "VD25OH"   Review of Systems  Constitutional:  Negative for chills, fatigue and fever.  HENT:  Negative for congestion, hearing loss, tinnitus, trouble swallowing and voice change.   Eyes:  Negative for visual disturbance.  Respiratory:  Negative for cough, chest tightness, shortness of breath and wheezing.   Cardiovascular:  Negative for chest pain, palpitations and leg swelling.  Gastrointestinal:  Negative for abdominal pain, constipation, diarrhea and vomiting.  Endocrine: Negative for polydipsia and polyuria.  Genitourinary:  Negative for dysuria, frequency, genital sores, vaginal bleeding and vaginal discharge.  Musculoskeletal:  Negative for arthralgias, gait problem and joint swelling.  Skin:  Negative for color change and rash.  Neurological:  Negative for dizziness, tremors, light-headedness and headaches.  Hematological:  Negative for adenopathy. Does not bruise/bleed easily.  Psychiatric/Behavioral:  Negative for dysphoric mood and sleep disturbance. The patient is not nervous/anxious.     Patient Active Problem List   Diagnosis Date Noted   Myalgia, lower leg 12/30/2022  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/14/2022   Essential hypertension 08/20/2022   Type II diabetes mellitus with complication (HCC) 08/20/2022   Hyperlipidemia associated with type 2 diabetes mellitus (HCC) 08/20/2022   Mild intermittent asthma without complication 08/20/2022   History of  endometrial cancer 08/20/2022    Allergies  Allergen Reactions   Clarithromycin Other (See Comments)    Past Surgical History:  Procedure Laterality Date   ABDOMINAL HYSTERECTOMY  2018   BLADDER SURGERY  2000   and 2018   BREAST BIOPSY Left    2004-benign    Social History   Tobacco Use   Smoking status: Never   Smokeless tobacco: Never  Vaping Use   Vaping Use: Never used  Substance Use Topics   Alcohol use: Not Currently   Drug use: Never     Medication list has been reviewed and updated.  Current Meds  Medication Sig   ASPIRIN 81 PO aspirin   b complex vitamins capsule Take 1 capsule by mouth daily.   benazepril (LOTENSIN) 40 MG tablet Take 1 tablet (40 mg total) by mouth daily.   calcium carbonate (OS-CAL) 600 MG tablet Take 600 mg by mouth 2 (two) times daily with a meal.   carvedilol (COREG) 12.5 MG tablet Take 1 tablet (12.5 mg total) by mouth 2 (two) times daily with a meal.   fluticasone-salmeterol (WIXELA INHUB) 100-50 MCG/ACT AEPB Inhale 1 puff into the lungs 2 (two) times daily.   hydrochlorothiazide (HYDRODIURIL) 25 MG tablet Take 1 tablet by mouth every day.   multivitamin-lutein (OCUVITE-LUTEIN) CAPS capsule Take 1 capsule by mouth daily.   potassium chloride (KLOR-CON) 10 MEQ tablet Take 1 tablet (10 mEq total) by mouth daily.   [DISCONTINUED] montelukast (SINGULAIR) 10 MG tablet Take 1 tablet by mouth every day.   [DISCONTINUED] rosuvastatin (CRESTOR) 5 MG tablet Take 1 tablet (5 mg total) by mouth daily. (Patient taking differently: Take 5 mg by mouth 3 (three) times a week.)       04/30/2023   10:46 AM 12/30/2022    1:33 PM 08/20/2022    2:37 PM  GAD 7 : Generalized Anxiety Score  Nervous, Anxious, on Edge 0 0 0  Control/stop worrying 0 0 2  Worry too much - different things 0 0 0  Trouble relaxing 0 0 0  Restless 0 0 0  Easily annoyed or irritable 0 0 0  Afraid - awful might happen 0 0 0  Total GAD 7 Score 0 0 2  Anxiety Difficulty Not  difficult at all Not difficult at all Not difficult at all       04/30/2023   10:46 AM 01/29/2023   12:15 PM 12/30/2022    1:33 PM  Depression screen PHQ 2/9  Decreased Interest 0 0 0  Down, Depressed, Hopeless 0 0 0  PHQ - 2 Score 0 0 0  Altered sleeping 0 0 0  Tired, decreased energy 0 0 0  Change in appetite 0 0 0  Feeling bad or failure about yourself  0 0 0  Trouble concentrating 0 0 0  Moving slowly or fidgety/restless 0 0 0  Suicidal thoughts 0 0 0  PHQ-9 Score 0 0 0  Difficult doing work/chores Not difficult at all Not difficult at all Not difficult at all    BP Readings from Last 3 Encounters:  04/30/23 126/76  12/30/22 136/72  10/14/22 128/80    Physical Exam Vitals and nursing note reviewed.  Constitutional:      General:  She is not in acute distress.    Appearance: She is well-developed.  HENT:     Head: Normocephalic and atraumatic.     Right Ear: Tympanic membrane and ear canal normal.     Left Ear: Tympanic membrane and ear canal normal.     Nose:     Right Sinus: No maxillary sinus tenderness.     Left Sinus: No maxillary sinus tenderness.  Eyes:     General: No scleral icterus.       Right eye: No discharge.        Left eye: No discharge.     Conjunctiva/sclera: Conjunctivae normal.  Neck:     Thyroid: No thyromegaly.     Vascular: No carotid bruit.  Cardiovascular:     Rate and Rhythm: Normal rate and regular rhythm.     Pulses: Normal pulses.     Heart sounds: Normal heart sounds.  Pulmonary:     Effort: Pulmonary effort is normal. No respiratory distress.     Breath sounds: No wheezing.  Chest:  Breasts:    Right: No mass, nipple discharge, skin change or tenderness.     Left: No mass, nipple discharge, skin change or tenderness.  Abdominal:     General: Bowel sounds are normal.     Palpations: Abdomen is soft.     Tenderness: There is no abdominal tenderness.  Musculoskeletal:     Cervical back: Normal range of motion. No erythema.      Right lower leg: No edema.     Left lower leg: No edema.  Lymphadenopathy:     Cervical: No cervical adenopathy.  Skin:    General: Skin is warm and dry.     Findings: No rash.  Neurological:     Mental Status: She is alert and oriented to person, place, and time.     Cranial Nerves: No cranial nerve deficit.     Sensory: No sensory deficit.     Deep Tendon Reflexes: Reflexes are normal and symmetric.  Psychiatric:        Attention and Perception: Attention normal.        Mood and Affect: Mood normal.   Urine dipstick shows positive for nitrates and positive for leukocytes.  Micro exam: not done.   Wt Readings from Last 3 Encounters:  04/30/23 128 lb 3.2 oz (58.2 kg)  01/29/23 128 lb (58.1 kg)  12/30/22 128 lb (58.1 kg)    BP 126/76 (BP Location: Left Arm, Cuff Size: Normal)   Pulse 85   Ht 5\' 1"  (1.549 m)   Wt 128 lb 3.2 oz (58.2 kg)   SpO2 95%   BMI 24.22 kg/m   Assessment and Plan:  Problem List Items Addressed This Visit     Type II diabetes mellitus with complication (HCC) (Chronic)    Blood sugars stable without hypoglycemic symptoms or events. Currently being treated with diet alone. Lab Results  Component Value Date   HGBA1C 6.8 (H) 12/30/2022        Relevant Medications   rosuvastatin (CRESTOR) 5 MG tablet   Other Relevant Orders   Hemoglobin A1c   Comprehensive metabolic panel   Mild intermittent asthma without complication (Chronic)    She has been without Advair or albuterol due to cost (does not have part D) Will send in Wixela to see if is affordable. She will request albuterol neb solution when needed      Relevant Medications   montelukast (SINGULAIR) 10 MG tablet  fluticasone-salmeterol (WIXELA INHUB) 100-50 MCG/ACT AEPB   Hyperlipidemia associated with type 2 diabetes mellitus (HCC) (Chronic)    Tolerating statin medications better since reducing the dose to three times per week.  No side effects noted. LDL is  Lab Results   Component Value Date   LDLCALC 83 08/20/2022  On Crestor 5 mg MWF        Relevant Medications   rosuvastatin (CRESTOR) 5 MG tablet   Other Relevant Orders   Lipid panel   Essential hypertension - Primary (Chronic)    Stable exam with well controlled BP.  Currently taking lisinopril, coreg and hctz. Tolerating medications without concerns or side effects. Will continue to recommend low sodium diet and current regimen.       Relevant Medications   rosuvastatin (CRESTOR) 5 MG tablet   Other Relevant Orders   CBC with Differential/Platelet   TSH   POCT urinalysis dipstick (Completed)   Other Visit Diagnoses     Postmenopausal estrogen deficiency       reminded patient to schedule DEXA   Foul smelling urine       Relevant Orders   Urine Culture       Return in about 6 months (around 10/31/2023) for HTN.   Partially dictated using Dragon software, any errors are not intentional.  Reubin Milan, MD [facility] [place], [place]

## 2023-04-30 NOTE — Assessment & Plan Note (Signed)
Stable exam with well controlled BP.  Currently taking lisinopril, coreg and hctz. Tolerating medications without concerns or side effects. Will continue to recommend low sodium diet and current regimen.

## 2023-05-01 LAB — LIPID PANEL
Chol/HDL Ratio: 3 ratio (ref 0.0–4.4)
Cholesterol, Total: 195 mg/dL (ref 100–199)
HDL: 66 mg/dL (ref >39)
LDL Chol Calc (NIH): 92 mg/dL (ref 0–99)
Triglycerides: 222 mg/dL — ABNORMAL HIGH (ref 0–149)
VLDL Cholesterol Cal: 37 mg/dL (ref 5–40)

## 2023-05-01 LAB — COMPREHENSIVE METABOLIC PANEL
ALT: 19 IU/L (ref 0–32)
AST: 25 IU/L (ref 0–40)
Albumin/Globulin Ratio: 2 (ref 1.2–2.2)
Albumin: 4.3 g/dL (ref 3.8–4.8)
Alkaline Phosphatase: 75 IU/L (ref 44–121)
BUN/Creatinine Ratio: 13 (ref 12–28)
BUN: 13 mg/dL (ref 8–27)
Bilirubin Total: 0.3 mg/dL (ref 0.0–1.2)
CO2: 22 mmol/L (ref 20–29)
Calcium: 10.4 mg/dL — ABNORMAL HIGH (ref 8.7–10.3)
Chloride: 99 mmol/L (ref 96–106)
Creatinine, Ser: 0.97 mg/dL (ref 0.57–1.00)
Globulin, Total: 2.2 g/dL (ref 1.5–4.5)
Glucose: 130 mg/dL — ABNORMAL HIGH (ref 70–99)
Potassium: 3.8 mmol/L (ref 3.5–5.2)
Sodium: 140 mmol/L (ref 134–144)
Total Protein: 6.5 g/dL (ref 6.0–8.5)
eGFR: 60 mL/min/{1.73_m2} (ref >59)

## 2023-05-01 LAB — CBC WITH DIFFERENTIAL/PLATELET
Basophils Absolute: 0 10*3/uL (ref 0.0–0.2)
Basos: 1 %
EOS (ABSOLUTE): 0.2 10*3/uL (ref 0.0–0.4)
Eos: 3 %
Hematocrit: 41.1 % (ref 34.0–46.6)
Hemoglobin: 14.2 g/dL (ref 11.1–15.9)
Immature Grans (Abs): 0 10*3/uL (ref 0.0–0.1)
Immature Granulocytes: 0 %
Lymphocytes Absolute: 1.8 10*3/uL (ref 0.7–3.1)
Lymphs: 36 %
MCH: 32.1 pg (ref 26.6–33.0)
MCHC: 34.5 g/dL (ref 31.5–35.7)
MCV: 93 fL (ref 79–97)
Monocytes Absolute: 0.4 10*3/uL (ref 0.1–0.9)
Monocytes: 7 %
Neutrophils Absolute: 2.6 10*3/uL (ref 1.4–7.0)
Neutrophils: 53 %
Platelets: 200 10*3/uL (ref 150–450)
RBC: 4.43 x10E6/uL (ref 3.77–5.28)
RDW: 12 % (ref 11.7–15.4)
WBC: 5 10*3/uL (ref 3.4–10.8)

## 2023-05-01 LAB — TSH: TSH: 1.73 u[IU]/mL (ref 0.450–4.500)

## 2023-05-01 LAB — HEMOGLOBIN A1C
Est. average glucose Bld gHb Est-mCnc: 148 mg/dL
Hgb A1c MFr Bld: 6.8 % — ABNORMAL HIGH (ref 4.8–5.6)

## 2023-05-05 LAB — URINE CULTURE

## 2023-05-06 ENCOUNTER — Other Ambulatory Visit: Payer: Self-pay

## 2023-05-06 ENCOUNTER — Other Ambulatory Visit: Payer: Self-pay | Admitting: Internal Medicine

## 2023-05-06 DIAGNOSIS — R7881 Bacteremia: Secondary | ICD-10-CM

## 2023-05-06 DIAGNOSIS — N3000 Acute cystitis without hematuria: Secondary | ICD-10-CM

## 2023-05-06 MED ORDER — NITROFURANTOIN MONOHYD MACRO 100 MG PO CAPS
100.0000 mg | ORAL_CAPSULE | Freq: Two times a day (BID) | ORAL | 0 refills | Status: DC
Start: 2023-05-06 — End: 2023-05-07

## 2023-05-07 ENCOUNTER — Encounter: Payer: Self-pay | Admitting: Internal Medicine

## 2023-05-07 ENCOUNTER — Telehealth: Payer: Self-pay | Admitting: Internal Medicine

## 2023-05-07 ENCOUNTER — Other Ambulatory Visit: Payer: Self-pay

## 2023-05-07 DIAGNOSIS — R7881 Bacteremia: Secondary | ICD-10-CM

## 2023-05-07 MED ORDER — NITROFURANTOIN MONOHYD MACRO 100 MG PO CAPS
100.0000 mg | ORAL_CAPSULE | Freq: Two times a day (BID) | ORAL | 0 refills | Status: AC
Start: 2023-05-07 — End: 2023-05-14

## 2023-05-07 NOTE — Telephone Encounter (Signed)
Medication Refill - Medication: nitrofurantoin, macrocrystal-monohydrate, (MACROBID) 100 MG capsule  Has the patient contacted their pharmacy? Yes.    Preferred Pharmacy (with phone number or street name):  [facility] [phone] [place], [place] - [address] AT [address] [address] Phone: [phone]  Fax: [phone]     Has the patient been seen for an appointment in the last year OR does the patient have an upcoming appointment? No.  Agent: Please be advised that RX refills may take up to 3 business days. We ask that you follow-up with your pharmacy.

## 2023-05-07 NOTE — Telephone Encounter (Signed)
This was sent in yesterday

## 2023-05-12 ENCOUNTER — Encounter: Payer: Self-pay | Admitting: Internal Medicine

## 2023-05-12 ENCOUNTER — Telehealth: Payer: Self-pay | Admitting: Internal Medicine

## 2023-05-12 NOTE — Telephone Encounter (Signed)
Please review.  KP

## 2023-05-12 NOTE — Telephone Encounter (Signed)
Completed and faxed back.  KP

## 2023-05-12 NOTE — Telephone Encounter (Signed)
Copied from CRM [id number]. Topic: Referral - Status >> May 12, 2023  3:45 PM Carrielelia G wrote: Please refax her [facility] breast imaging referral , it did not have her date of birth on it.  To [phone]

## 2023-05-14 LAB — HM DEXA SCAN

## 2023-05-16 ENCOUNTER — Telehealth: Payer: Self-pay | Admitting: Internal Medicine

## 2023-05-16 ENCOUNTER — Encounter: Payer: Self-pay | Admitting: Internal Medicine

## 2023-05-16 NOTE — Telephone Encounter (Signed)
Please inform patient that her DEXA showed osteopenia - low bone mass but not osteoporotic range.  She should take calcium and vitamin D daily and consider repeat DEXA in 2-3 years.

## 2023-05-19 NOTE — Telephone Encounter (Signed)
Called pt left VM to call back.  KP 

## 2023-05-19 NOTE — Telephone Encounter (Signed)
Called patient in regard to done density scan results. Patient verbal understanding.

## 2023-05-20 NOTE — Telephone Encounter (Signed)
Patient informed of results and vit d and calcium.

## 2023-05-20 NOTE — Telephone Encounter (Signed)
Pt called  and bone density result notes of Dr Judithann Graves provided on 05/20/23. Pt needs advice on dose of Vitamin D.

## 2023-07-18 ENCOUNTER — Telehealth: Payer: Self-pay | Admitting: Internal Medicine

## 2023-07-18 NOTE — Telephone Encounter (Signed)
The patient is NOT a diabetic and does NOT need diabetic supplies. 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  - Zale Marcotte

## 2023-07-18 NOTE — Telephone Encounter (Signed)
Copied from CRM [id number]. Topic: General - Inquiry >> Jul 18, 2023  8:14 AM De Blanch wrote: Reason for LKG:[id number] from [employer] stated she has an order on file for pt CGM supplies. Needs doctors last notes dated in the last 6 months and mention that CGM supplies.  Albin Felling is sending fax w/info to office.   Please advise.

## 2023-07-23 ENCOUNTER — Encounter: Payer: Self-pay | Admitting: Internal Medicine

## 2023-07-24 ENCOUNTER — Other Ambulatory Visit: Payer: Self-pay

## 2023-07-24 DIAGNOSIS — I10 Essential (primary) hypertension: Secondary | ICD-10-CM

## 2023-07-24 MED ORDER — CARVEDILOL 12.5 MG PO TABS
12.5000 mg | ORAL_TABLET | Freq: Two times a day (BID) | ORAL | 1 refills | Status: DC
Start: 2023-07-24 — End: 2023-11-11

## 2023-07-24 NOTE — Telephone Encounter (Signed)
Called and spoke with patient. Patient called pharmacy and they said they needed a new Rx sent over because at the time we sent the fills in, the pharmacy did not have this RX so they transferred the prescription to [facility] in [place], but never transferred the prescription back. Sent in refills to [facility] as patient requested.  - Gaytha Raybourn

## 2023-07-24 NOTE — Telephone Encounter (Signed)
Pt response.  KP

## 2023-08-01 ENCOUNTER — Telehealth: Payer: Self-pay | Admitting: Internal Medicine

## 2023-08-01 NOTE — Telephone Encounter (Signed)
The patient is NOT a diabetic and does NOT need diabetic supplies. 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     KP

## 2023-08-01 NOTE — Telephone Encounter (Signed)
Copied from CRM [id number]. Topic: General - Other >> Aug 01, 2023 11:33 AM Turkey B wrote: Reason for CRM: Chelsea from [employer], checking status of form that was fxed on 08/17 for diabetic supplies. Please call [facility]

## 2023-09-05 ENCOUNTER — Encounter: Payer: Self-pay | Admitting: Internal Medicine

## 2023-10-27 ENCOUNTER — Encounter: Payer: Self-pay | Admitting: Internal Medicine

## 2023-10-27 ENCOUNTER — Telehealth: Payer: Self-pay | Admitting: Internal Medicine

## 2023-10-27 ENCOUNTER — Ambulatory Visit (INDEPENDENT_AMBULATORY_CARE_PROVIDER_SITE_OTHER): Payer: Medicare Other | Admitting: Internal Medicine

## 2023-10-27 VITALS — BP 128/72 | HR 75 | Temp 97.3°F | Ht 61.0 in | Wt 125.0 lb

## 2023-10-27 DIAGNOSIS — I10 Essential (primary) hypertension: Secondary | ICD-10-CM | POA: Diagnosis not present

## 2023-10-27 DIAGNOSIS — M19072 Primary osteoarthritis, left ankle and foot: Secondary | ICD-10-CM

## 2023-10-27 DIAGNOSIS — Z23 Encounter for immunization: Secondary | ICD-10-CM | POA: Diagnosis not present

## 2023-10-27 DIAGNOSIS — Z7984 Long term (current) use of oral hypoglycemic drugs: Secondary | ICD-10-CM

## 2023-10-27 DIAGNOSIS — E118 Type 2 diabetes mellitus with unspecified complications: Secondary | ICD-10-CM | POA: Diagnosis not present

## 2023-10-27 NOTE — Assessment & Plan Note (Signed)
Blood sugars stable without hypoglycemic symptoms or events. Currently managed with diet. Changes made last visit are none. Lab Results  Component Value Date   HGBA1C 6.8 (H) 04/30/2023

## 2023-10-27 NOTE — Assessment & Plan Note (Signed)
Controlled BP with normal exam. Current regimen is coreg, lisinopril and hctz. Will continue same medications; encourage continued reduced sodium diet.

## 2023-10-27 NOTE — Progress Notes (Signed)
Date:  10/27/2023   Name:  Kimberly Robertson   DOB:  June 22, 1946   MRN:  [id number]   Chief Complaint: Hypertension  Hypertension This is a chronic problem. The problem is controlled. Pertinent negatives include no chest pain, headaches, palpitations or shortness of breath. Past treatments include ACE inhibitors, beta blockers and diuretics.  Foot Injury  Incident onset: started over one year ago. There was no injury mechanism. The pain is present in the left foot. The quality of the pain is described as aching. The pain is mild. The pain has been Constant since onset. The symptoms are aggravated by movement and weight bearing.  Diabetes She presents for her follow-up diabetic visit. She has type 2 diabetes mellitus. Her disease course has been stable. Pertinent negatives for hypoglycemia include no dizziness or headaches. Pertinent negatives for diabetes include no chest pain, no fatigue and no weakness. Current diabetic treatment includes diet. There is no change in her home blood glucose trend.    Review of Systems  Constitutional:  Negative for fatigue and unexpected weight change.  HENT:  Negative for nosebleeds.   Eyes:  Negative for visual disturbance.  Respiratory:  Negative for cough, chest tightness, shortness of breath and wheezing.   Cardiovascular:  Negative for chest pain, palpitations and leg swelling.  Gastrointestinal:  Negative for abdominal pain, constipation and diarrhea.  Neurological:  Negative for dizziness, weakness, light-headedness and headaches.     Lab Results  Component Value Date   NA 140 04/30/2023   K 3.8 04/30/2023   CO2 22 04/30/2023   GLUCOSE 130 (H) 04/30/2023   BUN 13 04/30/2023   CREATININE 0.97 04/30/2023   CALCIUM 10.4 (H) 04/30/2023   EGFR 60 04/30/2023   Lab Results  Component Value Date   CHOL 195 04/30/2023   HDL 66 04/30/2023   LDLCALC 92 04/30/2023   TRIG 222 (H) 04/30/2023   CHOLHDL 3.0 04/30/2023   Lab Results  Component  Value Date   TSH 1.730 04/30/2023   Lab Results  Component Value Date   HGBA1C 6.8 (H) 04/30/2023   Lab Results  Component Value Date   WBC 5.0 04/30/2023   HGB 14.2 04/30/2023   HCT 41.1 04/30/2023   MCV 93 04/30/2023   PLT 200 04/30/2023   Lab Results  Component Value Date   ALT 19 04/30/2023   AST 25 04/30/2023   ALKPHOS 75 04/30/2023   BILITOT 0.3 04/30/2023   No results found for: "25OHVITD2", "25OHVITD3", "VD25OH"   Patient Active Problem List   Diagnosis Date Noted   Myalgia, lower leg 12/30/2022   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/14/2022   Essential hypertension 08/20/2022   Type II diabetes mellitus with complication (HCC) 08/20/2022   Hyperlipidemia associated with type 2 diabetes mellitus (HCC) 08/20/2022   Mild intermittent asthma without complication 08/20/2022   History of endometrial cancer 08/20/2022    Allergies  Allergen Reactions   Clarithromycin Other (See Comments)    Past Surgical History:  Procedure Laterality Date   ABDOMINAL HYSTERECTOMY  2018   BLADDER SURGERY  2000   and 2018   BREAST BIOPSY Left    2004-benign    Social History   Tobacco Use   Smoking status: Never   Smokeless tobacco: Never  Vaping Use   Vaping status: Never Used  Substance Use Topics   Alcohol use: Not Currently   Drug use: Never     Medication list has been reviewed and updated.  Current Meds  Medication  Sig   ASPIRIN 81 PO aspirin   b complex vitamins capsule Take 1 capsule by mouth daily.   benazepril (LOTENSIN) 40 MG tablet Take 1 tablet (40 mg total) by mouth daily.   calcium carbonate (OS-CAL) 600 MG tablet Take 600 mg by mouth 2 (two) times daily with a meal.   carvedilol (COREG) 12.5 MG tablet Take 1 tablet (12.5 mg total) by mouth 2 (two) times daily with a meal.   hydrochlorothiazide (HYDRODIURIL) 25 MG tablet Take 1 tablet by mouth every day.   montelukast (SINGULAIR) 10 MG tablet Take 1 tablet by mouth every day.   multivitamin-lutein  (OCUVITE-LUTEIN) CAPS capsule Take 1 capsule by mouth daily.   potassium chloride (KLOR-CON) 10 MEQ tablet Take 1 tablet (10 mEq total) by mouth daily.   rosuvastatin (CRESTOR) 5 MG tablet Take 1 tablet (5 mg total) by mouth 3 (three) times a week.   [DISCONTINUED] fluticasone-salmeterol (WIXELA INHUB) 100-50 MCG/ACT AEPB Inhale 1 puff into the lungs 2 (two) times daily.       04/30/2023   10:46 AM 12/30/2022    1:33 PM 08/20/2022    2:37 PM  GAD 7 : Generalized Anxiety Score  Nervous, Anxious, on Edge 0 0 0  Control/stop worrying 0 0 2  Worry too much - different things 0 0 0  Trouble relaxing 0 0 0  Restless 0 0 0  Easily annoyed or irritable 0 0 0  Afraid - awful might happen 0 0 0  Total GAD 7 Score 0 0 2  Anxiety Difficulty Not difficult at all Not difficult at all Not difficult at all       04/30/2023   10:46 AM 01/29/2023   12:15 PM 12/30/2022    1:33 PM  Depression screen PHQ 2/9  Decreased Interest 0 0 0  Down, Depressed, Hopeless 0 0 0  PHQ - 2 Score 0 0 0  Altered sleeping 0 0 0  Tired, decreased energy 0 0 0  Change in appetite 0 0 0  Feeling bad or failure about yourself  0 0 0  Trouble concentrating 0 0 0  Moving slowly or fidgety/restless 0 0 0  Suicidal thoughts 0 0 0  PHQ-9 Score 0 0 0  Difficult doing work/chores Not difficult at all Not difficult at all Not difficult at all    BP Readings from Last 3 Encounters:  10/27/23 128/72  04/30/23 126/76  12/30/22 136/72    Physical Exam Vitals and nursing note reviewed.  Constitutional:      General: She is not in acute distress.    Appearance: Normal appearance. She is well-developed.  HENT:     Head: Normocephalic and atraumatic.  Cardiovascular:     Rate and Rhythm: Normal rate and regular rhythm.  Pulmonary:     Effort: Pulmonary effort is normal. No respiratory distress.     Breath sounds: No wheezing or rhonchi.  Musculoskeletal:     Cervical back: Normal range of motion.     Right lower leg:  No edema.     Left lower leg: No edema.  Skin:    General: Skin is warm and dry.     Capillary Refill: Capillary refill takes less than 2 seconds.     Findings: No rash.  Neurological:     General: No focal deficit present.     Mental Status: She is alert and oriented to person, place, and time.  Psychiatric:        Mood and Affect:  Mood normal.        Behavior: Behavior normal.     Wt Readings from Last 3 Encounters:  10/27/23 125 lb (56.7 kg)  04/30/23 128 lb 3.2 oz (58.2 kg)  01/29/23 128 lb (58.1 kg)    BP 128/72   Pulse 75   Temp (!) 97.3 F (36.3 C) (Oral)   Ht 5\' 1"  (1.549 m)   Wt 125 lb (56.7 kg)   SpO2 98%   BMI 23.62 kg/m   Assessment and Plan:  Problem List Items Addressed This Visit       Unprioritized   Essential hypertension - Primary (Chronic)    Controlled BP with normal exam. Current regimen is coreg, lisinopril and hctz. Will continue same medications; encourage continued reduced sodium diet.       Type II diabetes mellitus with complication (HCC) (Chronic)    Blood sugars stable without hypoglycemic symptoms or events. Currently managed with diet. Changes made last visit are none. Lab Results  Component Value Date   HGBA1C 6.8 (H) 04/30/2023         Relevant Orders   Hemoglobin A1c   Basic metabolic panel   Osteoarthritis of left foot    Continue supportive care, Voltaren topically PRN      Other Visit Diagnoses     Need for influenza vaccination       Relevant Orders   Flu Vaccine Trivalent High Dose (Fluad) (Completed)   Long term current use of oral hypoglycemic drug           Return in about 6 months (around 04/25/2024) for Medicare annual, DM, HTN.    Reubin Milan, MD [facility] [place]

## 2023-10-27 NOTE — Assessment & Plan Note (Signed)
Continue supportive care, Voltaren topically PRN

## 2023-10-27 NOTE — Telephone Encounter (Signed)
Seen last year by Dr. Ashley Royalty for foot pain.  Thought perhaps a fracture so the CMA got her a boot.  After Dr. Ashley Royalty evaluated her, he did not have her wear the boot.  She then got a bill for the boot which she never obtained.  She just now is bringing this up.  Dr. Ashley Royalty says that [employer] should be able to correct this and get her a refund.

## 2023-10-28 LAB — BASIC METABOLIC PANEL
BUN/Creatinine Ratio: 19 (ref 12–28)
BUN: 14 mg/dL (ref 8–27)
CO2: 24 mmol/L (ref 20–29)
Calcium: 10 mg/dL (ref 8.7–10.3)
Chloride: 100 mmol/L (ref 96–106)
Creatinine, Ser: 0.75 mg/dL (ref 0.57–1.00)
Glucose: 103 mg/dL — ABNORMAL HIGH (ref 70–99)
Potassium: 3.9 mmol/L (ref 3.5–5.2)
Sodium: 139 mmol/L (ref 134–144)
eGFR: 81 mL/min/{1.73_m2} (ref >59)

## 2023-10-28 LAB — HEMOGLOBIN A1C
Est. average glucose Bld gHb Est-mCnc: 146 mg/dL
Hgb A1c MFr Bld: 6.7 % — ABNORMAL HIGH (ref 4.8–5.6)

## 2023-10-28 NOTE — Telephone Encounter (Signed)
Spoke to rep [employer] today, he said he will contact his billing dept and have them reverse charges for patient.

## 2023-10-31 ENCOUNTER — Ambulatory Visit: Payer: 59 | Admitting: Internal Medicine

## 2023-11-11 ENCOUNTER — Other Ambulatory Visit: Payer: Self-pay

## 2023-11-11 DIAGNOSIS — I10 Essential (primary) hypertension: Secondary | ICD-10-CM

## 2023-11-11 MED ORDER — CARVEDILOL 12.5 MG PO TABS
12.5000 mg | ORAL_TABLET | Freq: Two times a day (BID) | ORAL | 0 refills | Status: DC
Start: 2023-11-11 — End: 2024-02-13

## 2023-11-22 ENCOUNTER — Other Ambulatory Visit: Payer: Self-pay | Admitting: Internal Medicine

## 2023-11-22 DIAGNOSIS — I10 Essential (primary) hypertension: Secondary | ICD-10-CM

## 2023-11-24 NOTE — Telephone Encounter (Signed)
Requested Prescriptions  Pending Prescriptions Disp Refills   potassium chloride (KLOR-CON) 10 MEQ tablet [Pharmacy Med Name: POTASSIUM CL ER 10 MEQ TABLET] 90 tablet 0    Sig: Take 1 tablet (10 mEq total) by mouth daily.     Endocrinology:  Minerals - Potassium Supplementation Passed - 11/24/2023  1:44 PM      Passed - K in normal range and within 360 days    Potassium  Date Value Ref Range Status  10/27/2023 3.9 3.5 - 5.2 mmol/L Final         Passed - Cr in normal range and within 360 days    Creatinine, Ser  Date Value Ref Range Status  10/27/2023 0.75 0.57 - 1.00 mg/dL Final         Passed - Valid encounter within last 12 months    Recent Outpatient Visits           4 weeks ago Essential hypertension   [place] [facility] at [facility], Nyoka Cowden, MD   6 months ago Essential hypertension   [place] [facility] at [facility], Nyoka Cowden, MD   10 months ago Type II diabetes mellitus with complication [facility])   [place] [facility] at [facility], Nyoka Cowden, MD   1 year ago Osteoarthritis of left foot, unspecified osteoarthritis type   [facility] at [facility], Ocie Bob, MD   1 year ago Essential hypertension   [facility] at [facility], Nyoka Cowden, MD       Future Appointments             In 5 months Judithann Graves, Nyoka Cowden, MD [facility] at [facility], [facility]

## 2023-12-17 ENCOUNTER — Other Ambulatory Visit: Payer: Self-pay

## 2023-12-17 DIAGNOSIS — I10 Essential (primary) hypertension: Secondary | ICD-10-CM

## 2023-12-17 DIAGNOSIS — E1169 Type 2 diabetes mellitus with other specified complication: Secondary | ICD-10-CM

## 2023-12-17 DIAGNOSIS — E785 Hyperlipidemia, unspecified: Secondary | ICD-10-CM

## 2023-12-17 MED ORDER — HYDROCHLOROTHIAZIDE 25 MG PO TABS: ORAL_TABLET | ORAL | 1 refills | Status: DC

## 2023-12-17 MED ORDER — ROSUVASTATIN CALCIUM 5 MG PO TABS: 5.0000 mg | ORAL_TABLET | ORAL | Status: DC

## 2023-12-17 MED ORDER — BENAZEPRIL HCL 40 MG PO TABS: 40.0000 mg | ORAL_TABLET | Freq: Every day | ORAL | 1 refills | Status: DC

## 2024-01-19 ENCOUNTER — Encounter: Payer: Self-pay | Admitting: Internal Medicine

## 2024-02-04 ENCOUNTER — Ambulatory Visit (INDEPENDENT_AMBULATORY_CARE_PROVIDER_SITE_OTHER): Payer: Medicare Other

## 2024-02-04 ENCOUNTER — Telehealth: Payer: Self-pay

## 2024-02-04 DIAGNOSIS — Z0000 Encounter for general adult medical examination without abnormal findings: Secondary | ICD-10-CM

## 2024-02-04 DIAGNOSIS — E118 Type 2 diabetes mellitus with unspecified complications: Secondary | ICD-10-CM

## 2024-02-04 NOTE — Telephone Encounter (Signed)
 Called phone # listed on chart: # is invalid therefore could not leave voicemail for pt.

## 2024-02-04 NOTE — Progress Notes (Signed)
 Subjective:   Kimberly Robertson is a 78 y.o. who presents for a Medicare Wellness preventive visit.  Visit Complete: Virtual I connected with  Kimberly Robertson on 02/04/24 by a audio enabled telemedicine application and verified that I am speaking with the correct person using two identifiers.  Patient Location: Home  Provider Location: Office/Clinic  I discussed the limitations of evaluation and management by telemedicine. The patient expressed understanding and agreed to proceed.  Vital Signs: Because this visit was a virtual/telehealth visit, some criteria may be missing or patient reported. Any vitals not documented were not able to be obtained and vitals that have been documented are patient reported.  VideoDeclined- This patient declined Librarian, academic. Therefore the visit was completed with audio only.  AWV Questionnaire: No: Patient Medicare AWV questionnaire was not completed prior to this visit.  Cardiac Risk Factors include: advanced age (>55men, >38 women);diabetes mellitus;dyslipidemia;hypertension     Objective:    There were no vitals filed for this visit. There is no height or weight on file to calculate BMI.     02/04/2024   10:52 AM 01/29/2023   12:16 PM 10/18/2022    1:34 PM  Advanced Directives  Does Patient Have a Medical Advance Directive? No No No  Would patient like information on creating a medical advance directive? No - Patient declined No - Patient declined No - Guardian declined    Current Medications (verified) Outpatient Encounter Medications as of 02/04/2024  Medication Sig   ASPIRIN 81 PO aspirin   b complex vitamins capsule Take 1 capsule by mouth daily.   benazepril (LOTENSIN) 40 MG tablet Take 1 tablet (40 mg total) by mouth daily.   calcium carbonate (OS-CAL) 600 MG tablet Take 600 mg by mouth 2 (two) times daily with a meal.   carvedilol (COREG) 12.5 MG tablet Take 1 tablet (12.5 mg total) by mouth 2 (two)  times daily with a meal.   hydrochlorothiazide (HYDRODIURIL) 25 MG tablet Take 1 tablet by mouth every day.   montelukast (SINGULAIR) 10 MG tablet Take 1 tablet by mouth every day.   multivitamin-lutein (OCUVITE-LUTEIN) CAPS capsule Take 1 capsule by mouth daily.   potassium chloride (KLOR-CON) 10 MEQ tablet Take 1 tablet (10 mEq total) by mouth daily.   rosuvastatin (CRESTOR) 5 MG tablet Take 1 tablet (5 mg total) by mouth 3 (three) times a week.   No facility-administered encounter medications on file as of 02/04/2024.    Allergies (verified) Clarithromycin   History: Past Medical History:  Diagnosis Date   Asthma    Cancer (HCC) 08/2018   endometrial   Depression    Diabetes mellitus without complication (HCC)    Hyperlipidemia    Hypertension    Past Surgical History:  Procedure Laterality Date   ABDOMINAL HYSTERECTOMY  2018   BLADDER SURGERY  2000   and 2018   BREAST BIOPSY Left    2004-benign   Family History  Problem Relation Age of Onset   Cancer Father    Heart attack Father    Diabetes Maternal Aunt    Cancer Maternal Aunt    Diabetes Maternal Uncle    Cancer Maternal Uncle    Breast cancer Paternal Aunt    Cancer Paternal Uncle    Social History   Socioeconomic History   Marital status: Married    Spouse name: Dontasia Miranda   Number of children: 2   Years of education: Not on file   Highest education level:  Associate degree: occupational, technical, or vocational program  Occupational History   Not on file  Tobacco Use   Smoking status: Never   Smokeless tobacco: Never  Vaping Use   Vaping status: Never Used  Substance and Sexual Activity   Alcohol use: Not Currently   Drug use: Never   Sexual activity: Not Currently  Other Topics Concern   Not on file  Social History Narrative   Not on file   Social Drivers of Health   Financial Resource Strain: Low Risk  (02/04/2024)   Overall Financial Resource Strain (CARDIA)    Difficulty of  Paying Living Expenses: Not hard at all  Food Insecurity: No Food Insecurity (02/04/2024)   Hunger Vital Sign    Worried About Running Out of Food in the Last Year: Never true    Ran Out of Food in the Last Year: Never true  Transportation Needs: No Transportation Needs (02/04/2024)   PRAPARE - Administrator, Civil Service (Medical): No    Lack of Transportation (Non-Medical): No  Physical Activity: Insufficiently Active (02/04/2024)   Exercise Vital Sign    Days of Exercise per Week: 3 days    Minutes of Exercise per Session: 20 min  Stress: No Stress Concern Present (02/04/2024)   [employer] of Occupational Health - Occupational Stress Questionnaire    Feeling of Stress : Only a little  Social Connections: Moderately Isolated (02/04/2024)   Social Connection and Isolation Panel [NHANES]    Frequency of Communication with Friends and Family: Twice a week    Frequency of Social Gatherings with Friends and Family: Twice a week    Attends Religious Services: Never    Database administrator or Organizations: No    Attends Engineer, structural: Never    Marital Status: Married    Tobacco Counseling Counseling given: Not Answered    Clinical Intake:  Pre-visit preparation completed: Yes  Pain : No/denies pain     BMI - recorded: 23.6 Nutritional Status: BMI of 19-24  Normal Nutritional Risks: None Diabetes: Yes CBG done?: No Did pt. bring in CBG monitor from home?: No  How often do you need to have someone help you when you read instructions, pamphlets, or other written materials from your doctor or pharmacy?: 1 - Never  Interpreter Needed?: No  Information entered by :: Kennedy Bucker, LPN   Activities of Daily Living     02/04/2024   10:54 AM  In your present state of health, do you have any difficulty performing the following activities:  Hearing? 0  Vision? 0  Difficulty concentrating or making decisions? 0  Walking or climbing stairs?  1  Dressing or bathing? 0  Doing errands, shopping? 0  Preparing Food and eating ? N  Using the Toilet? N  In the past six months, have you accidently leaked urine? Y  Do you have problems with loss of bowel control? N  Managing your Medications? N  Managing your Finances? N  Housekeeping or managing your Housekeeping? N    Patient Care Team: Reubin Milan, MD as PCP - General (Internal Medicine) [facility] (Ophthalmology)  Indicate any recent Medical Services you may have received from other than [facility] providers in the past year (date may be approximate).     Assessment:   This is a routine wellness examination for Ciara.  Hearing/Vision screen Hearing Screening - Comments:: NO AIDS Vision Screening - Comments:: WEARS GLASSES ALL THE TIME- MD IN [place]  Goals Addressed             This Visit's Progress    DIET - INCREASE WATER INTAKE         Depression Screen     02/04/2024   10:50 AM 04/30/2023   10:46 AM 01/29/2023   12:15 PM 12/30/2022    1:33 PM 10/18/2022    1:34 PM 08/20/2022    2:36 PM  PHQ 2/9 Scores  PHQ - 2 Score 0 0 0 0 0 0  PHQ- 9 Score 0 0 0 0 0 0    Fall Risk     02/04/2024   10:53 AM 04/30/2023   10:46 AM 01/29/2023   12:17 PM 01/25/2023   12:25 PM 12/30/2022    1:34 PM  Fall Risk   Falls in the past year? 1 0 0 0 0  Number falls in past yr: 0 0 0 0 0  Injury with Fall? 0 0 0 0 0  Risk for fall due to : History of fall(s) No Fall Risks No Fall Risks  No Fall Risks  Follow up Falls prevention discussed;Falls evaluation completed Falls evaluation completed Falls prevention discussed;Falls evaluation completed  Falls evaluation completed    MEDICARE RISK AT HOME:  Medicare Risk at Home Any stairs in or around the home?: Yes If so, are there any without handrails?: No Home free of loose throw rugs in walkways, pet beds, electrical cords, etc?: Yes Adequate lighting in your home to reduce risk of falls?: Yes Life alert?: No Use of a cane,  walker or w/c?: No Grab bars in the bathroom?: No Shower chair or bench in shower?: Yes Elevated toilet seat or a handicapped toilet?: No  TIMED UP AND GO:  Was the test performed?  No  Cognitive Function: 6CIT completed        02/04/2024   10:56 AM 01/29/2023   12:21 PM 10/18/2022    1:36 PM  6CIT Screen  What Year? 0 points 0 points 0 points  What month? 0 points 0 points 0 points  What time? 0 points 0 points 0 points  Count back from 20 0 points 0 points 0 points  Months in reverse 0 points 0 points 0 points  Repeat phrase 0 points 0 points 4 points  Total Score 0 points 0 points 4 points    Immunizations Immunization History  Administered Date(s) Administered   Fluad Quad(high Dose 65+) 08/20/2022   Fluad Trivalent(High Dose 65+) 10/27/2023   PNEUMOCOCCAL CONJUGATE-20 12/30/2022    Screening Tests Health Maintenance  Topic Date Due   DTaP/Tdap/Td (1 - Tdap) Never done   Zoster Vaccines- Shingrix (1 of 2) Never done   DEXA SCAN  Never done   OPHTHALMOLOGY EXAM  08/16/2023   FOOT EXAM  08/21/2023   Diabetic kidney evaluation - Urine ACR  12/31/2023   HEMOGLOBIN A1C  04/25/2024   Diabetic kidney evaluation - eGFR measurement  10/26/2024   Medicare Annual Wellness (AWV)  02/03/2025   Pneumonia Vaccine 48+ Years old  Completed   INFLUENZA VACCINE  Completed   Hepatitis C Screening  Completed   HPV VACCINES  Aged Out   COVID-19 Vaccine  Discontinued    Health Maintenance  Health Maintenance Due  Topic Date Due   DTaP/Tdap/Td (1 - Tdap) Never done   Zoster Vaccines- Shingrix (1 of 2) Never done   DEXA SCAN  Never done   OPHTHALMOLOGY EXAM  08/16/2023   FOOT EXAM  08/21/2023  Diabetic kidney evaluation - Urine ACR  12/31/2023   Health Maintenance Items Addressed:   Additional Screening:  Vision Screening: Recommended annual ophthalmology exams for early detection of glaucoma and other disorders of the eye.  Dental Screening: Recommended annual  dental exams for proper oral hygiene  Community Resource Referral / Chronic Care Management: CRR required this visit?  No   CCM required this visit?  No     Plan:     I have personally reviewed and noted the following in the patient's chart:   Medical and social history Use of alcohol, tobacco or illicit drugs  Current medications and supplements including opioid prescriptions. Patient is not currently taking opioid prescriptions. Functional ability and status Nutritional status Physical activity Advanced directives List of other physicians Hospitalizations, surgeries, and ER visits in previous 12 months Vitals Screenings to include cognitive, depression, and falls Referrals and appointments  In addition, I have reviewed and discussed with patient certain preventive protocols, quality metrics, and best practice recommendations. A written personalized care plan for preventive services as well as general preventive health recommendations were provided to patient.     Hal Hope, LPN   5/78/4696   After Visit Summary: (MyChart) Due to this being a telephonic visit, the after visit summary with patients personalized plan was offered to patient via MyChart   Notes: Nothing significant to report at this time.

## 2024-02-04 NOTE — Telephone Encounter (Signed)
 Called pt left VM to call back.  KP

## 2024-02-04 NOTE — Telephone Encounter (Signed)
-----   Message from Bari Edward sent at 02/03/2024  4:16 PM EST ----- Regarding: FW: MEDS What pharmacy does she want to use?  Carvedilol went to [facility] in December. ----- Message ----- From: Hal Hope, LPN Sent: 03/17/8118   1:03 PM EST To: Reubin Milan, MD Subject: MEDS                                           Hi Dr.Berglund, this lady said that the pharmacy said there was a 'problem' with her rosuvastatin and carvedilol prescriptions. Can you refill these for her? Thanks, [employer]

## 2024-02-04 NOTE — Patient Instructions (Addendum)
 Kimberly Robertson , Thank you for taking time to come for your Medicare Wellness Visit. I appreciate your ongoing commitment to your health goals. Please review the following plan we discussed and let me know if I can assist you in the future.   Referrals/Orders/Follow-Ups/Clinician Recommendations: NONE  This is a list of the screening recommended for you and due dates:  Health Maintenance  Topic Date Due   DTaP/Tdap/Td vaccine (1 - Tdap) Never done   Zoster (Shingles) Vaccine (1 of 2) Never done   DEXA scan (bone density measurement)  Never done   Eye exam for diabetics  08/16/2023   Complete foot exam   08/21/2023   Yearly kidney health urinalysis for diabetes  12/31/2023   Hemoglobin A1C  04/25/2024   Yearly kidney function blood test for diabetes  10/26/2024   Medicare Annual Wellness Visit  02/03/2025   Pneumonia Vaccine  Completed   Flu Shot  Completed   Hepatitis C Screening  Completed   HPV Vaccine  Aged Out   COVID-19 Vaccine  Discontinued    Advanced directives: (ACP Link)Information on Advanced Care Planning can be found at [facility] Advance Health Care Directives Advance Health Care Directives ([email or web address]   Next Medicare Annual Wellness Visit scheduled for next year: Yes   02/09/25 @ 10:10 AM BY PHONE

## 2024-02-12 ENCOUNTER — Other Ambulatory Visit: Payer: Self-pay | Admitting: Internal Medicine

## 2024-02-12 DIAGNOSIS — I10 Essential (primary) hypertension: Secondary | ICD-10-CM

## 2024-02-12 DIAGNOSIS — E785 Hyperlipidemia, unspecified: Secondary | ICD-10-CM

## 2024-02-13 ENCOUNTER — Other Ambulatory Visit: Payer: Self-pay

## 2024-02-13 ENCOUNTER — Other Ambulatory Visit: Payer: Self-pay | Admitting: Internal Medicine

## 2024-02-13 DIAGNOSIS — I10 Essential (primary) hypertension: Secondary | ICD-10-CM

## 2024-02-13 DIAGNOSIS — E1169 Type 2 diabetes mellitus with other specified complication: Secondary | ICD-10-CM

## 2024-02-13 MED ORDER — ROSUVASTATIN CALCIUM 5 MG PO TABS: 5.0000 mg | ORAL_TABLET | ORAL | 1 refills | Status: DC

## 2024-02-13 MED ORDER — CARVEDILOL 12.5 MG PO TABS: 12.5000 mg | ORAL_TABLET | Freq: Two times a day (BID) | ORAL | 0 refills | Status: DC

## 2024-02-13 MED ORDER — CARVEDILOL 12.5 MG PO TABS: 12.5000 mg | ORAL_TABLET | Freq: Two times a day (BID) | ORAL | 1 refills | Status: DC

## 2024-02-13 NOTE — Telephone Encounter (Signed)
 Please review refill for rosuvastatin.  KP

## 2024-03-04 LAB — HM MAMMOGRAPHY

## 2024-04-26 ENCOUNTER — Ambulatory Visit (INDEPENDENT_AMBULATORY_CARE_PROVIDER_SITE_OTHER): Payer: Self-pay | Admitting: Internal Medicine

## 2024-04-26 ENCOUNTER — Encounter: Payer: Self-pay | Admitting: Internal Medicine

## 2024-04-26 VITALS — BP 112/70 | HR 58 | Ht 61.0 in | Wt 125.2 lb

## 2024-04-26 DIAGNOSIS — E118 Type 2 diabetes mellitus with unspecified complications: Secondary | ICD-10-CM

## 2024-04-26 DIAGNOSIS — Z7984 Long term (current) use of oral hypoglycemic drugs: Secondary | ICD-10-CM

## 2024-04-26 DIAGNOSIS — E1169 Type 2 diabetes mellitus with other specified complication: Secondary | ICD-10-CM

## 2024-04-26 DIAGNOSIS — E785 Hyperlipidemia, unspecified: Secondary | ICD-10-CM

## 2024-04-26 DIAGNOSIS — Z13820 Encounter for screening for osteoporosis: Secondary | ICD-10-CM

## 2024-04-26 DIAGNOSIS — I10 Essential (primary) hypertension: Secondary | ICD-10-CM

## 2024-04-26 DIAGNOSIS — J4520 Mild intermittent asthma, uncomplicated: Secondary | ICD-10-CM | POA: Diagnosis not present

## 2024-04-26 DIAGNOSIS — Z0000 Encounter for general adult medical examination without abnormal findings: Secondary | ICD-10-CM

## 2024-04-26 DIAGNOSIS — Z1231 Encounter for screening mammogram for malignant neoplasm of breast: Secondary | ICD-10-CM

## 2024-04-26 MED ORDER — ROSUVASTATIN CALCIUM 5 MG PO TABS: 5.0000 mg | ORAL_TABLET | Freq: Every day | ORAL | 1 refills | Status: DC

## 2024-04-26 NOTE — Assessment & Plan Note (Signed)
 LDL is  Lab Results  Component Value Date   LDLCALC 92 04/30/2023   Current regimen is Crestor  every day.  Previous Rx for three times per week but she misunderstood and is taking it daily.  No medication side effects noted. Goal LDL is <70.

## 2024-04-26 NOTE — Progress Notes (Signed)
 Date:  04/26/2024   Name:  Kimberly Robertson   DOB:  07/22/1946   MRN:  [id number]   Chief Complaint: Annual Exam (Sig on rosuvastatin  is 3 times a week, patient is confused on how to take medication ) Kimberly Robertson is a 78 y.o. female who presents today for her Complete Annual Exam. She feels well. She reports exercising walks, 3 times a week for 30 minutes. She reports she is sleeping well. Breast complaints none.  Health Maintenance  Topic Date Due   DTaP/Tdap/Td vaccine (1 - Tdap) Never done   Zoster (Shingles) Vaccine (1 of 2) Never done   DEXA scan (bone density measurement)  Never done   Eye exam for diabetics  08/16/2023   Yearly kidney health urinalysis for diabetes  12/31/2023   Hemoglobin A1C  04/25/2024   Flu Shot  07/09/2024   Yearly kidney function blood test for diabetes  10/26/2024   Medicare Annual Wellness Visit  02/03/2025   Complete foot exam   04/26/2025   Pneumonia Vaccine  Completed   Hepatitis C Screening  Completed   HPV Vaccine  Aged Out   Meningitis B Vaccine  Aged Out   COVID-19 Vaccine  Discontinued    Hypertension This is a chronic problem. The problem is controlled. Pertinent negatives include no chest pain, headaches, palpitations or shortness of breath. Past treatments include ACE inhibitors, beta blockers and diuretics. The current treatment provides significant improvement. There is no history of kidney disease, CAD/MI or CVA.  Diabetes She presents for her follow-up diabetic visit. She has type 2 diabetes mellitus. Her disease course has been stable. Pertinent negatives for hypoglycemia include no dizziness or headaches. Pertinent negatives for diabetes include no chest pain, no fatigue and no weakness. Pertinent negatives for diabetic complications include no CVA. Current diabetic treatment includes diet. She is compliant with treatment most of the time.  Hyperlipidemia This is a chronic problem. The problem is controlled. Pertinent negatives  include no chest pain, myalgias or shortness of breath. Current antihyperlipidemic treatment includes statins.    Review of Systems  Constitutional:  Negative for fatigue and unexpected weight change.  HENT:  Negative for trouble swallowing.   Eyes:  Negative for visual disturbance.  Respiratory:  Negative for cough, chest tightness, shortness of breath and wheezing.   Cardiovascular:  Negative for chest pain, palpitations and leg swelling.  Gastrointestinal:  Negative for abdominal pain, constipation and diarrhea.  Musculoskeletal:  Negative for arthralgias and myalgias.  Neurological:  Negative for dizziness, weakness, light-headedness and headaches.     Lab Results  Component Value Date   NA 139 10/27/2023   K 3.9 10/27/2023   CO2 24 10/27/2023   GLUCOSE 103 (H) 10/27/2023   BUN 14 10/27/2023   CREATININE 0.75 10/27/2023   CALCIUM  10.0 10/27/2023   EGFR 81 10/27/2023   Lab Results  Component Value Date   CHOL 195 04/30/2023   HDL 66 04/30/2023   LDLCALC 92 04/30/2023   TRIG 222 (H) 04/30/2023   CHOLHDL 3.0 04/30/2023   Lab Results  Component Value Date   TSH 1.730 04/30/2023   Lab Results  Component Value Date   HGBA1C 6.7 (H) 10/27/2023   Lab Results  Component Value Date   WBC 5.0 04/30/2023   HGB 14.2 04/30/2023   HCT 41.1 04/30/2023   MCV 93 04/30/2023   PLT 200 04/30/2023   Lab Results  Component Value Date   ALT 19 04/30/2023   AST 25 04/30/2023  ALKPHOS 75 04/30/2023   BILITOT 0.3 04/30/2023   No results found for: "25OHVITD2", "25OHVITD3", "VD25OH"   Patient Active Problem List   Diagnosis Date Noted   Myalgia, lower leg 12/30/2022   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/14/2022   Essential hypertension 08/20/2022   Type II diabetes mellitus with complication (HCC) 08/20/2022   Hyperlipidemia associated with type 2 diabetes mellitus (HCC) 08/20/2022   Mild intermittent asthma without complication 08/20/2022   History of endometrial cancer  08/20/2022    Allergies  Allergen Reactions   Clarithromycin Other (See Comments)    Past Surgical History:  Procedure Laterality Date   ABDOMINAL HYSTERECTOMY  2018   BLADDER SURGERY  2000   and 2018   BREAST BIOPSY Left    2004-benign    Social History   Tobacco Use   Smoking status: Never   Smokeless tobacco: Never  Vaping Use   Vaping status: Never Used  Substance Use Topics   Alcohol use: Not Currently   Drug use: Never     Medication list has been reviewed and updated.  Current Meds  Medication Sig   ASPIRIN 81 PO aspirin   b complex vitamins capsule Take 1 capsule by mouth daily.   benazepril  (LOTENSIN ) 40 MG tablet Take 1 tablet (40 mg total) by mouth daily.   calcium  carbonate (OS-CAL) 600 MG tablet Take 600 mg by mouth 2 (two) times daily with a meal.   carvedilol  (COREG ) 12.5 MG tablet Take 1 tablet (12.5 mg total) by mouth 2 (two) times daily with a meal.   hydrochlorothiazide  (HYDRODIURIL ) 25 MG tablet Take 1 tablet by mouth every day.   montelukast  (SINGULAIR ) 10 MG tablet Take 1 tablet by mouth every day.   multivitamin-lutein (OCUVITE-LUTEIN) CAPS capsule Take 1 capsule by mouth daily.   potassium chloride  (KLOR-CON ) 10 MEQ tablet Take 1 tablet (10 mEq total) by mouth daily.   [DISCONTINUED] rosuvastatin  (CRESTOR ) 5 MG tablet Take 1 tablet (5 mg total) by mouth 3 (three) times a week.       04/26/2024   10:38 AM 04/30/2023   10:46 AM 12/30/2022    1:33 PM 08/20/2022    2:37 PM  GAD 7 : Generalized Anxiety Score  Nervous, Anxious, on Edge 0 0 0 0  Control/stop worrying 0 0 0 2  Worry too much - different things 3 0 0 0  Trouble relaxing 0 0 0 0  Restless 0 0 0 0  Easily annoyed or irritable 0 0 0 0  Afraid - awful might happen 3 0 0 0  Total GAD 7 Score 6 0 0 2  Anxiety Difficulty Not difficult at all Not difficult at all Not difficult at all Not difficult at all       04/26/2024   10:37 AM 02/04/2024   10:50 AM 04/30/2023   10:46 AM   Depression screen PHQ 2/9  Decreased Interest 0 0 0  Down, Depressed, Hopeless 0 0 0  PHQ - 2 Score 0 0 0  Altered sleeping 0 0 0  Tired, decreased energy 3 0 0  Change in appetite 0 0 0  Feeling bad or failure about yourself  0 0 0  Trouble concentrating 0 0 0  Moving slowly or fidgety/restless 0 0 0  Suicidal thoughts 0 0 0  PHQ-9 Score 3 0 0  Difficult doing work/chores Not difficult at all Not difficult at all Not difficult at all    BP Readings from Last 3 Encounters:  04/26/24 112/70  10/27/23 128/72  04/30/23 126/76    Physical Exam Vitals and nursing note reviewed.  Constitutional:      General: She is not in acute distress.    Appearance: She is well-developed.  HENT:     Head: Normocephalic and atraumatic.     Right Ear: Tympanic membrane and ear canal normal.     Left Ear: Tympanic membrane and ear canal normal.     Nose:     Right Sinus: No maxillary sinus tenderness.     Left Sinus: No maxillary sinus tenderness.  Eyes:     General: No scleral icterus.       Right eye: No discharge.        Left eye: No discharge.     Conjunctiva/sclera: Conjunctivae normal.  Neck:     Thyroid: No thyromegaly.     Vascular: No carotid bruit.  Cardiovascular:     Rate and Rhythm: Normal rate and regular rhythm.     Pulses: Normal pulses.     Heart sounds: Normal heart sounds.  Pulmonary:     Effort: Pulmonary effort is normal. No respiratory distress.     Breath sounds: No wheezing.  Abdominal:     General: Bowel sounds are normal.     Palpations: Abdomen is soft.     Tenderness: There is no abdominal tenderness.  Musculoskeletal:     Cervical back: Normal range of motion. No erythema.     Right lower leg: No edema.     Left lower leg: No edema.  Lymphadenopathy:     Cervical: No cervical adenopathy.  Skin:    General: Skin is warm and dry.     Findings: No rash.  Neurological:     Mental Status: She is alert and oriented to person, place, and time.      Cranial Nerves: No cranial nerve deficit.     Sensory: No sensory deficit.     Deep Tendon Reflexes: Reflexes are normal and symmetric.  Psychiatric:        Attention and Perception: Attention normal.        Mood and Affect: Mood normal.    Diabetic Foot Exam - Simple   Simple Foot Form Diabetic Foot exam was performed with the following findings: Yes 04/26/2024 10:48 AM  Visual Inspection No deformities, no ulcerations, no other skin breakdown bilaterally: Yes Sensation Testing Intact to touch and monofilament testing bilaterally: Yes Pulse Check Posterior Tibialis and Dorsalis pulse intact bilaterally: Yes Comments      Wt Readings from Last 3 Encounters:  04/26/24 125 lb 4 oz (56.8 kg)  10/27/23 125 lb (56.7 kg)  04/30/23 128 lb 3.2 oz (58.2 kg)    BP 112/70   Pulse (!) 58   Ht 5\' 1"  (1.549 m)   Wt 125 lb 4 oz (56.8 kg)   SpO2 98%   BMI 23.67 kg/m   Assessment and Plan:  Problem List Items Addressed This Visit       Unprioritized   Essential hypertension - Primary (Chronic)   Blood pressure is well controlled.  Current medications benazepril , Coreg  and hydrochlorothiazide . Will continue same regimen along with efforts to limit dietary sodium.       Relevant Medications   rosuvastatin  (CRESTOR ) 5 MG tablet   Other Relevant Orders   CBC with Differential/Platelet   Comprehensive metabolic panel with GFR   TSH   Type II diabetes mellitus with complication (HCC) (Chronic)   Blood sugars have been stable.  No recent  hypoglycemic events requiring assistance. Currently medications are none - diet only. Lab Results  Component Value Date   HGBA1C 6.7 (H) 10/27/2023   Last visit no changes were made.       Relevant Medications   rosuvastatin  (CRESTOR ) 5 MG tablet   Other Relevant Orders   Comprehensive metabolic panel with GFR   Hemoglobin A1c   Microalbumin / creatinine urine ratio   Hyperlipidemia associated with type 2 diabetes mellitus (HCC)  (Chronic)   LDL is  Lab Results  Component Value Date   LDLCALC 92 04/30/2023   Current regimen is Crestor  every day.  Previous Rx for three times per week but she misunderstood and is taking it daily.  No medication side effects noted. Goal LDL is <70.       Relevant Medications   rosuvastatin  (CRESTOR ) 5 MG tablet   Other Relevant Orders   Lipid panel   Mild intermittent asthma without complication (Chronic)   Other Visit Diagnoses       Encounter for screening mammogram for breast cancer       recently done by GYN       Return in about 4 months (around 08/27/2024) for DM, HTN.    Sheron Dixons, MD [facility] [place]

## 2024-04-26 NOTE — Assessment & Plan Note (Signed)
 Blood pressure is well controlled.  Current medications benazepril , Coreg  and hydrochlorothiazide . Will continue same regimen along with efforts to limit dietary sodium.

## 2024-04-26 NOTE — Assessment & Plan Note (Signed)
 Blood sugars have been stable.  No recent hypoglycemic events requiring assistance. Currently medications are none - diet only. Lab Results  Component Value Date   HGBA1C 6.7 (H) 10/27/2023   Last visit no changes were made.

## 2024-04-27 ENCOUNTER — Ambulatory Visit: Payer: Self-pay | Admitting: Internal Medicine

## 2024-04-27 LAB — COMPREHENSIVE METABOLIC PANEL WITH GFR
ALT: 23 IU/L (ref 0–32)
AST: 25 IU/L (ref 0–40)
Albumin: 4.4 g/dL (ref 3.8–4.8)
Alkaline Phosphatase: 84 IU/L (ref 44–121)
BUN/Creatinine Ratio: 18 (ref 12–28)
BUN: 16 mg/dL (ref 8–27)
Bilirubin Total: 0.4 mg/dL (ref 0.0–1.2)
CO2: 23 mmol/L (ref 20–29)
Calcium: 10.3 mg/dL (ref 8.7–10.3)
Chloride: 101 mmol/L (ref 96–106)
Creatinine, Ser: 0.9 mg/dL (ref 0.57–1.00)
Globulin, Total: 2 g/dL (ref 1.5–4.5)
Glucose: 171 mg/dL — ABNORMAL HIGH (ref 70–99)
Potassium: 4.3 mmol/L (ref 3.5–5.2)
Sodium: 139 mmol/L (ref 134–144)
Total Protein: 6.4 g/dL (ref 6.0–8.5)
eGFR: 65 mL/min/{1.73_m2} (ref >59)

## 2024-04-27 LAB — CBC WITH DIFFERENTIAL/PLATELET
Basophils Absolute: 0 10*3/uL (ref 0.0–0.2)
Basos: 1 %
EOS (ABSOLUTE): 0.1 10*3/uL (ref 0.0–0.4)
Eos: 3 %
Hematocrit: 41.2 % (ref 34.0–46.6)
Hemoglobin: 14 g/dL (ref 11.1–15.9)
Immature Grans (Abs): 0 10*3/uL (ref 0.0–0.1)
Immature Granulocytes: 0 %
Lymphocytes Absolute: 1.7 10*3/uL (ref 0.7–3.1)
Lymphs: 32 %
MCH: 32 pg (ref 26.6–33.0)
MCHC: 34 g/dL (ref 31.5–35.7)
MCV: 94 fL (ref 79–97)
Monocytes Absolute: 0.4 10*3/uL (ref 0.1–0.9)
Monocytes: 8 %
Neutrophils Absolute: 2.9 10*3/uL (ref 1.4–7.0)
Neutrophils: 56 %
Platelets: 180 10*3/uL (ref 150–450)
RBC: 4.37 x10E6/uL (ref 3.77–5.28)
RDW: 12.3 % (ref 11.7–15.4)
WBC: 5.2 10*3/uL (ref 3.4–10.8)

## 2024-04-27 LAB — LIPID PANEL
Chol/HDL Ratio: 3.1 ratio (ref 0.0–4.4)
Cholesterol, Total: 194 mg/dL (ref 100–199)
HDL: 63 mg/dL (ref >39)
LDL Chol Calc (NIH): 97 mg/dL (ref 0–99)
Triglycerides: 199 mg/dL — ABNORMAL HIGH (ref 0–149)
VLDL Cholesterol Cal: 34 mg/dL (ref 5–40)

## 2024-04-27 LAB — HEMOGLOBIN A1C
Est. average glucose Bld gHb Est-mCnc: 151 mg/dL
Hgb A1c MFr Bld: 6.9 % — ABNORMAL HIGH (ref 4.8–5.6)

## 2024-04-27 LAB — MICROALBUMIN / CREATININE URINE RATIO
Creatinine, Urine: 60.4 mg/dL
Microalb/Creat Ratio: <5 mg/g{creat} (ref 0–29)
Microalbumin, Urine: <3 ug/mL

## 2024-04-27 LAB — TSH: TSH: 1.55 u[IU]/mL (ref 0.450–4.500)

## 2024-05-24 ENCOUNTER — Other Ambulatory Visit: Payer: Self-pay | Admitting: Internal Medicine

## 2024-05-24 DIAGNOSIS — J4520 Mild intermittent asthma, uncomplicated: Secondary | ICD-10-CM

## 2024-05-26 NOTE — Telephone Encounter (Signed)
 Requested Prescriptions  Pending Prescriptions Disp Refills   montelukast  (SINGULAIR ) 10 MG tablet [Pharmacy Med Name: MONTELUKAST  SOD 10 MG TABLET] 90 tablet 0    Sig: Take 1 tablet by mouth every day.     Pulmonology:  Leukotriene Inhibitors Passed - 05/26/2024  2:17 PM      Passed - Valid encounter within last 12 months    Recent Outpatient Visits           1 month ago Essential hypertension   [place] [facility] at [facility], [place], MD

## 2024-05-28 ENCOUNTER — Other Ambulatory Visit: Payer: Self-pay

## 2024-05-28 DIAGNOSIS — I10 Essential (primary) hypertension: Secondary | ICD-10-CM

## 2024-05-28 MED ORDER — BENAZEPRIL HCL 40 MG PO TABS: 40.0000 mg | ORAL_TABLET | Freq: Every day | ORAL | 1 refills | Status: DC

## 2024-06-02 ENCOUNTER — Other Ambulatory Visit: Payer: Self-pay

## 2024-06-02 DIAGNOSIS — I10 Essential (primary) hypertension: Secondary | ICD-10-CM

## 2024-06-02 MED ORDER — HYDROCHLOROTHIAZIDE 25 MG PO TABS: ORAL_TABLET | ORAL | 1 refills | Status: DC

## 2024-06-09 ENCOUNTER — Telehealth: Payer: Self-pay | Admitting: Internal Medicine

## 2024-06-09 NOTE — Telephone Encounter (Signed)
 Copied from CRM [id number]. Topic: Appointments - Transfer of Care >> Jun 09, 2024  4:14 PM [name] wrote: Pt is requesting to transfer FROM: Kimberly Robertson Pt is requesting to transfer TO: Kimberly Robertson Reason for requested transfer: Current provider is leaving at the end of the year. It is the responsibility of the team the patient would like to transfer to (Dr. Dugal) to reach out to the patient if for any reason this transfer is not acceptable.

## 2024-06-15 ENCOUNTER — Encounter: Payer: Self-pay | Admitting: Internal Medicine

## 2024-06-15 ENCOUNTER — Other Ambulatory Visit: Payer: Self-pay

## 2024-06-15 DIAGNOSIS — I10 Essential (primary) hypertension: Secondary | ICD-10-CM

## 2024-06-15 MED ORDER — BENAZEPRIL HCL 40 MG PO TABS: 40.0000 mg | ORAL_TABLET | Freq: Every day | ORAL | 1 refills | Status: DC

## 2024-07-20 ENCOUNTER — Other Ambulatory Visit: Payer: Self-pay | Admitting: Internal Medicine

## 2024-07-20 DIAGNOSIS — I10 Essential (primary) hypertension: Secondary | ICD-10-CM

## 2024-07-23 NOTE — Telephone Encounter (Signed)
 Requested Prescriptions  Pending Prescriptions Disp Refills   potassium chloride  (KLOR-CON ) 10 MEQ tablet [Pharmacy Med Name: POTASSIUM CL ER 10 MEQ TABLET] 90 tablet 0    Sig: Take 1 tablet (10 mEq) by mouth daily.     Endocrinology:  Minerals - Potassium Supplementation Passed - 07/23/2024 11:58 AM      Passed - K in normal range and within 360 days    Potassium  Date Value Ref Range Status  04/26/2024 4.3 3.5 - 5.2 mmol/L Final         Passed - Cr in normal range and within 360 days    Creatinine, Ser  Date Value Ref Range Status  04/26/2024 0.90 0.57 - 1.00 mg/dL Final         Passed - Valid encounter within last 12 months    Recent Outpatient Visits           2 months ago Essential hypertension   [place] [facility] at [facility], Leita DEL, MD

## 2024-08-11 ENCOUNTER — Encounter: Payer: Self-pay | Admitting: Internal Medicine

## 2024-08-19 ENCOUNTER — Other Ambulatory Visit: Payer: Self-pay | Admitting: Internal Medicine

## 2024-08-19 DIAGNOSIS — J4520 Mild intermittent asthma, uncomplicated: Secondary | ICD-10-CM

## 2024-08-20 NOTE — Telephone Encounter (Signed)
 Requested Prescriptions  Pending Prescriptions Disp Refills   montelukast  (SINGULAIR ) 10 MG tablet [Pharmacy Med Name: MONTELUKAST  SOD 10 MG TABLET] 90 tablet 1    Sig: Take 1 tablet by mouth every day.     Pulmonology:  Leukotriene Inhibitors Passed - 08/20/2024 11:14 AM      Passed - Valid encounter within last 12 months    Recent Outpatient Visits           3 months ago Essential hypertension   [place] [facility] at [facility], Leita DEL, MD

## 2024-08-20 NOTE — Telephone Encounter (Signed)
 Please review.  KP

## 2024-08-27 ENCOUNTER — Ambulatory Visit: Payer: PRIVATE HEALTH INSURANCE | Admitting: Internal Medicine

## 2024-08-31 ENCOUNTER — Encounter: Payer: Self-pay | Admitting: Internal Medicine

## 2024-08-31 ENCOUNTER — Ambulatory Visit: Payer: PRIVATE HEALTH INSURANCE | Admitting: Internal Medicine

## 2024-08-31 VITALS — BP 122/64 | HR 68 | Ht 61.0 in | Wt 127.0 lb

## 2024-08-31 DIAGNOSIS — E1169 Type 2 diabetes mellitus with other specified complication: Secondary | ICD-10-CM | POA: Diagnosis not present

## 2024-08-31 DIAGNOSIS — I10 Essential (primary) hypertension: Secondary | ICD-10-CM

## 2024-08-31 DIAGNOSIS — H6123 Impacted cerumen, bilateral: Secondary | ICD-10-CM

## 2024-08-31 DIAGNOSIS — Z23 Encounter for immunization: Secondary | ICD-10-CM

## 2024-08-31 DIAGNOSIS — E785 Hyperlipidemia, unspecified: Secondary | ICD-10-CM

## 2024-08-31 DIAGNOSIS — E118 Type 2 diabetes mellitus with unspecified complications: Secondary | ICD-10-CM

## 2024-08-31 MED ORDER — CARVEDILOL 12.5 MG PO TABS: 12.5000 mg | ORAL_TABLET | Freq: Two times a day (BID) | ORAL | 1 refills | Status: AC

## 2024-08-31 MED ORDER — POTASSIUM CHLORIDE ER 10 MEQ PO TBCR: 10.0000 meq | EXTENDED_RELEASE_TABLET | Freq: Every day | ORAL | 1 refills | Status: AC

## 2024-08-31 MED ORDER — ROSUVASTATIN CALCIUM 5 MG PO TABS: 5.0000 mg | ORAL_TABLET | ORAL | 1 refills | Status: AC

## 2024-08-31 NOTE — Assessment & Plan Note (Addendum)
 Blood sugars have been stable.  No hypoglycemic events since last visit. Currently medications are none. Last visit medical regimen changes were none. Lab Results  Component Value Date   HGBA1C 6.9 (H) 04/26/2024  A1C today = 6.2 Will request eye exam.

## 2024-08-31 NOTE — Assessment & Plan Note (Signed)
 Blood pressure is well controlled on benazepril , Coreg  and hydrochlorothiazide . No medication side effects noted. Plan to continue current medications.

## 2024-08-31 NOTE — Progress Notes (Signed)
 Date:  08/31/2024   Name:  Kimberly Robertson   DOB:  Dec 20, 1945   MRN:  [id number]   Chief Complaint: Diabetes and Hypertension  Diabetes She presents for her follow-up diabetic visit. She has type 2 diabetes mellitus. Pertinent negatives for hypoglycemia include no headaches, nervousness/anxiousness or tremors. Pertinent negatives for diabetes include no chest pain, no fatigue, no polydipsia and no polyuria.  Hypertension This is a chronic problem. The problem is controlled. Pertinent negatives include no chest pain, headaches, palpitations or shortness of breath. Past treatments include ACE inhibitors, beta blockers and diuretics.  Hyperlipidemia This is a chronic problem. The problem is controlled. Associated symptoms include myalgias (improved with dose reduction). Pertinent negatives include no chest pain or shortness of breath. Current antihyperlipidemic treatment includes statins. The current treatment provides significant improvement of lipids.  Ear Fullness  There is pain in both ears. This is a recurrent problem. The problem occurs constantly. There has been no fever (itching mostly). Pertinent negatives include no abdominal pain, coughing, headaches or hearing loss.    Review of Systems  Constitutional:  Negative for appetite change, fatigue, fever and unexpected weight change.  HENT:  Negative for ear pain, hearing loss, tinnitus and trouble swallowing.   Eyes:  Negative for visual disturbance.  Respiratory:  Negative for cough, chest tightness and shortness of breath.   Cardiovascular:  Negative for chest pain, palpitations and leg swelling.  Gastrointestinal:  Negative for abdominal pain.  Endocrine: Negative for polydipsia and polyuria.  Genitourinary:  Negative for dysuria and hematuria.  Musculoskeletal:  Positive for myalgias (improved with dose reduction). Negative for arthralgias.  Neurological:  Negative for tremors, numbness and headaches.  Psychiatric/Behavioral:   Negative for dysphoric mood and sleep disturbance. The patient is not nervous/anxious.      Lab Results  Component Value Date   NA 139 04/26/2024   K 4.3 04/26/2024   CO2 23 04/26/2024   GLUCOSE 171 (H) 04/26/2024   BUN 16 04/26/2024   CREATININE 0.90 04/26/2024   CALCIUM  10.3 04/26/2024   EGFR 65 04/26/2024   Lab Results  Component Value Date   CHOL 194 04/26/2024   HDL 63 04/26/2024   LDLCALC 97 04/26/2024   TRIG 199 (H) 04/26/2024   CHOLHDL 3.1 04/26/2024   Lab Results  Component Value Date   TSH 1.550 04/26/2024   Lab Results  Component Value Date   HGBA1C 6.9 (H) 04/26/2024   Lab Results  Component Value Date   WBC 5.2 04/26/2024   HGB 14.0 04/26/2024   HCT 41.2 04/26/2024   MCV 94 04/26/2024   PLT 180 04/26/2024   Lab Results  Component Value Date   ALT 23 04/26/2024   AST 25 04/26/2024   ALKPHOS 84 04/26/2024   BILITOT 0.4 04/26/2024   No results found for: MARIEN BOLLS, VD25OH   Patient Active Problem List   Diagnosis Date Noted   Myalgia, lower leg 12/30/2022   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/14/2022   Essential hypertension 08/20/2022   Type II diabetes mellitus with complication (HCC) 08/20/2022   Hyperlipidemia associated with type 2 diabetes mellitus (HCC) 08/20/2022   Mild intermittent asthma without complication 08/20/2022   History of endometrial cancer 08/20/2022    Allergies  Allergen Reactions   Clarithromycin Other (See Comments)    Past Surgical History:  Procedure Laterality Date   ABDOMINAL HYSTERECTOMY  2018   BLADDER SURGERY  2000   and 2018   BREAST BIOPSY Left    2004-benign  TUBAL LIGATION      Social History   Tobacco Use   Smoking status: Never   Smokeless tobacco: Never  Vaping Use   Vaping status: Never Used  Substance Use Topics   Alcohol use: Not Currently   Drug use: Never     Medication list has been reviewed and updated.  Current Meds  Medication Sig   ASPIRIN 81 PO aspirin    b complex vitamins capsule Take 1 capsule by mouth daily.   benazepril  (LOTENSIN ) 40 MG tablet Take 1 tablet (40 mg total) by mouth daily.   calcium  carbonate (OS-CAL) 600 MG tablet Take 600 mg by mouth 2 (two) times daily with a meal.   hydrochlorothiazide  (HYDRODIURIL ) 25 MG tablet Take 1 tablet by mouth every day.   montelukast  (SINGULAIR ) 10 MG tablet Take 1 tablet by mouth every day.   multivitamin-lutein (OCUVITE-LUTEIN) CAPS capsule Take 1 capsule by mouth daily.   [DISCONTINUED] carvedilol  (COREG ) 12.5 MG tablet Take 1 tablet (12.5 mg total) by mouth 2 (two) times daily with a meal.   [DISCONTINUED] potassium chloride  (KLOR-CON ) 10 MEQ tablet Take 1 tablet (10 mEq) by mouth daily.   [DISCONTINUED] rosuvastatin  (CRESTOR ) 5 MG tablet Take 1 tablet (5 mg total) by mouth daily.       04/26/2024   10:38 AM 04/30/2023   10:46 AM 12/30/2022    1:33 PM 08/20/2022    2:37 PM  GAD 7 : Generalized Anxiety Score  Nervous, Anxious, on Edge 0 0 0 0  Control/stop worrying 0 0 0 2  Worry too much - different things 3 0 0 0  Trouble relaxing 0 0 0 0  Restless 0 0 0 0  Easily annoyed or irritable 0 0 0 0  Afraid - awful might happen 3 0 0 0  Total GAD 7 Score 6 0 0 2  Anxiety Difficulty Not difficult at all Not difficult at all Not difficult at all Not difficult at all       04/26/2024   10:37 AM 02/04/2024   10:50 AM 04/30/2023   10:46 AM  Depression screen PHQ 2/9  Decreased Interest 0 0 0  Down, Depressed, Hopeless 0 0 0  PHQ - 2 Score 0 0 0  Altered sleeping 0 0 0  Tired, decreased energy 3 0 0  Change in appetite 0 0 0  Feeling bad or failure about yourself  0 0 0  Trouble concentrating 0 0 0  Moving slowly or fidgety/restless 0 0 0  Suicidal thoughts 0 0 0  PHQ-9 Score 3 0 0  Difficult doing work/chores Not difficult at all Not difficult at all Not difficult at all    BP Readings from Last 3 Encounters:  08/31/24 122/64  04/26/24 112/70  10/27/23 128/72    Physical  Exam Vitals and nursing note reviewed.  Constitutional:      General: She is not in acute distress.    Appearance: Normal appearance. She is well-developed.  HENT:     Head: Normocephalic and atraumatic.     Right Ear: There is impacted cerumen.     Left Ear: There is impacted cerumen.  Cardiovascular:     Rate and Rhythm: Normal rate and regular rhythm.     Heart sounds: No murmur heard. Pulmonary:     Effort: Pulmonary effort is normal. No respiratory distress.     Breath sounds: No wheezing or rhonchi.  Musculoskeletal:     Cervical back: Normal range of motion.  Right lower leg: No edema.     Left lower leg: No edema.  Lymphadenopathy:     Cervical: No cervical adenopathy.  Skin:    General: Skin is warm and dry.     Findings: No rash.  Neurological:     General: No focal deficit present.     Mental Status: She is alert and oriented to person, place, and time.  Psychiatric:        Mood and Affect: Mood normal.        Behavior: Behavior normal.     Wt Readings from Last 3 Encounters:  08/31/24 127 lb (57.6 kg)  04/26/24 125 lb 4 oz (56.8 kg)  10/27/23 125 lb (56.7 kg)    BP 122/64   Pulse 68   Ht 5' 1 (1.549 m)   Wt 127 lb (57.6 kg)   SpO2 99%   BMI 24.00 kg/m   Assessment and Plan:  Problem List Items Addressed This Visit       Unprioritized   Essential hypertension (Chronic)   Blood pressure is well controlled on benazepril , Coreg  and hydrochlorothiazide . No medication side effects noted. Plan to continue current medications.       Relevant Medications   carvedilol  (COREG ) 12.5 MG tablet   potassium chloride  (KLOR-CON ) 10 MEQ tablet   rosuvastatin  (CRESTOR ) 5 MG tablet (Start on 09/01/2024)   Hyperlipidemia associated with type 2 diabetes mellitus (HCC) (Chronic)   She has reduced the Crestor  to 3 times per week and having much less leg discomfort. Will continue three times per week       Relevant Medications   carvedilol  (COREG ) 12.5 MG  tablet   rosuvastatin  (CRESTOR ) 5 MG tablet (Start on 09/01/2024)   Type II diabetes mellitus with complication (HCC) - Primary (Chronic)   Blood sugars have been stable.  No hypoglycemic events since last visit. Currently medications are none. Last visit medical regimen changes were none. Lab Results  Component Value Date   HGBA1C 6.9 (H) 04/26/2024  A1C today = 6.2 Will request eye exam.       Relevant Medications   rosuvastatin  (CRESTOR ) 5 MG tablet (Start on 09/01/2024)   Other Visit Diagnoses       Bilateral impacted cerumen       discontinue using Qtips in the ears see ENT for cleaning and assessment   Relevant Orders   Ambulatory referral to ENT     Encounter for immunization       Relevant Orders   Flu vaccine HIGH DOSE PF(Fluzone Trivalent) (Completed)      She is transferring PCP to [facility] next month.  No follow-ups on file.    Leita HILARIO Adie, MD [facility] [place]

## 2024-08-31 NOTE — Assessment & Plan Note (Signed)
 She has reduced the Crestor  to 3 times per week and having much less leg discomfort. Will continue three times per week

## 2024-09-13 ENCOUNTER — Ambulatory Visit (INDEPENDENT_AMBULATORY_CARE_PROVIDER_SITE_OTHER)
Admission: RE | Admit: 2024-09-13 | Discharge: 2024-09-13 | Disposition: A | Discharge status: Home / Self Care | Source: Ambulatory Visit | Attending: Family | Admitting: Family

## 2024-09-13 ENCOUNTER — Encounter: Payer: Self-pay | Admitting: Family

## 2024-09-13 ENCOUNTER — Ambulatory Visit: Payer: Self-pay | Admitting: Family

## 2024-09-13 ENCOUNTER — Ambulatory Visit: Payer: PRIVATE HEALTH INSURANCE | Admitting: Family

## 2024-09-13 ENCOUNTER — Ambulatory Visit
Admission: RE | Admit: 2024-09-13 | Discharge: 2024-09-13 | Disposition: A | Discharge status: Home / Self Care | Source: Ambulatory Visit | Attending: Family | Admitting: Family

## 2024-09-13 VITALS — BP 114/72 | HR 74 | Temp 98.2°F | Ht 61.0 in | Wt 128.2 lb

## 2024-09-13 DIAGNOSIS — Z8781 Personal history of (healed) traumatic fracture: Secondary | ICD-10-CM

## 2024-09-13 DIAGNOSIS — J4520 Mild intermittent asthma, uncomplicated: Secondary | ICD-10-CM | POA: Diagnosis not present

## 2024-09-13 DIAGNOSIS — M79672 Pain in left foot: Secondary | ICD-10-CM

## 2024-09-13 DIAGNOSIS — N3945 Continuous leakage: Secondary | ICD-10-CM

## 2024-09-13 DIAGNOSIS — E785 Hyperlipidemia, unspecified: Secondary | ICD-10-CM

## 2024-09-13 DIAGNOSIS — Z8542 Personal history of malignant neoplasm of other parts of uterus: Secondary | ICD-10-CM

## 2024-09-13 DIAGNOSIS — E118 Type 2 diabetes mellitus with unspecified complications: Secondary | ICD-10-CM | POA: Diagnosis not present

## 2024-09-13 DIAGNOSIS — E1169 Type 2 diabetes mellitus with other specified complication: Secondary | ICD-10-CM

## 2024-09-13 DIAGNOSIS — M7918 Myalgia, other site: Secondary | ICD-10-CM

## 2024-09-13 DIAGNOSIS — N6320 Unspecified lump in the left breast, unspecified quadrant: Secondary | ICD-10-CM | POA: Diagnosis not present

## 2024-09-13 LAB — POCT GLYCOSYLATED HEMOGLOBIN (HGB A1C): Hemoglobin A1C: 6.6 % — AB (ref 4.0–5.6)

## 2024-09-13 MED ORDER — ALBUTEROL SULFATE HFA 108 (90 BASE) MCG/ACT IN AERS: 2.0000 | INHALATION_SPRAY | Freq: Four times a day (QID) | RESPIRATORY_TRACT | 0 refills | Status: AC | PRN

## 2024-09-13 MED ORDER — ALBUTEROL SULFATE (2.5 MG/3ML) 0.083% IN NEBU: 2.5000 mg | INHALATION_SOLUTION | Freq: Four times a day (QID) | RESPIRATORY_TRACT | 1 refills | Status: AC | PRN

## 2024-09-13 NOTE — Progress Notes (Signed)
 Established Patient Office Visit  Subjective:      CC:  Chief Complaint  Patient presents with   Transfer of Care    HPI: Kimberly Robertson is a 78 y.o. female presenting on 09/13/2024 for Transfer of Care .  Discussed the use of AI scribe software for clinical note transcription with the patient, who gave verbal consent to proceed.  History of Present Illness Kimberly Robertson is an 78 year old female with hypertension and diabetes who presents with incontinence and left foot pain.  She experiences severe incontinence, characterized by continuous leakage that necessitates the use of pads. Standing with the urge to urinate results in significant leakage. She underwent a pelvic sling procedure in 1999 and another bladder procedure during her hysterectomy in 2018, but her symptoms have worsened since then.  She has left foot pain that began approximately two years ago, described as a stabbing sensation that disrupts her sleep. She reports that an x-ray was performed on August 20, 2022, and she was told she had a mildly displaced fracture fragment and moderate degenerative changes. She experiences swelling and stiffness, especially after prolonged sitting.  She has asthma and uses a nebulizer during bronchitis episodes, though she hasn't had bronchitis in three years. She also has an albuterol inhaler, which is currently out of date.  She experiences leg cramps at night, particularly in the right leg, described as a 'muscle thing.' She uses Salonpas and finds relief by walking around.  She has a history of ulcers and takes low-dose aspirin three times a week to avoid irritation. She avoids ibuprofen and naproxen due to their potential to irritate her stomach.  Prior Pcp Dr. Delfin     Lab Results  Component Value Date   HGBA1C 6.9 (H) 04/26/2024       Social history:  Relevant past medical, surgical, family and social history reviewed and updated as indicated. Interim  medical history since our last visit reviewed.  Allergies and medications reviewed and updated.  DATA REVIEWED: CHART IN EPIC     ROS: Negative unless specifically indicated above in HPI.    Current Outpatient Medications:    albuterol (PROVENTIL) (2.5 MG/3ML) 0.083% nebulizer solution, Take 3 mLs (2.5 mg total) by nebulization every 6 (six) hours as needed for wheezing or shortness of breath., Disp: 150 mL, Rfl: 1   albuterol (VENTOLIN HFA) 108 (90 Base) MCG/ACT inhaler, Inhale 2 puffs into the lungs every 6 (six) hours as needed for wheezing or shortness of breath., Disp: 8 g, Rfl: 0   ASPIRIN 81 PO, aspirin, Disp: , Rfl:    b complex vitamins capsule, Take 1 capsule by mouth daily., Disp: , Rfl:    benazepril  (LOTENSIN ) 40 MG tablet, Take 1 tablet (40 mg total) by mouth daily., Disp: 90 tablet, Rfl: 1   calcium  carbonate (OS-CAL) 600 MG tablet, Take 600 mg by mouth 2 (two) times daily with a meal., Disp: , Rfl:    carvedilol  (COREG ) 12.5 MG tablet, Take 1 tablet (12.5 mg total) by mouth 2 (two) times daily with a meal., Disp: 180 tablet, Rfl: 1   hydrochlorothiazide  (HYDRODIURIL ) 25 MG tablet, Take 1 tablet by mouth every day., Disp: 90 tablet, Rfl: 1   montelukast  (SINGULAIR ) 10 MG tablet, Take 1 tablet by mouth every day., Disp: 90 tablet, Rfl: 1   multivitamin-lutein (OCUVITE-LUTEIN) CAPS capsule, Take 1 capsule by mouth daily., Disp: , Rfl:    potassium chloride  (KLOR-CON ) 10 MEQ tablet, Take 1 tablet (10 mEq total)  by mouth daily., Disp: 90 tablet, Rfl: 1   rosuvastatin  (CRESTOR ) 5 MG tablet, Take 1 tablet (5 mg total) by mouth 3 (three) times a week., Disp: 36 tablet, Rfl: 1        Objective:        BP 114/72 (BP Location: Left Arm, Patient Position: Sitting, Cuff Size: Normal)   Pulse 74   Temp 98.2 F (36.8 C) (Temporal)   Ht 5' 1 (1.549 m)   Wt 128 lb 3.2 oz (58.2 kg)   SpO2 98%   BMI 24.22 kg/m   Physical Exam EXTREMITIES: Tenderness on palpation of the  left foot.  Wt Readings from Last 3 Encounters:  09/13/24 128 lb 3.2 oz (58.2 kg)  08/31/24 127 lb (57.6 kg)  04/26/24 125 lb 4 oz (56.8 kg)    Physical Exam Vitals reviewed.  Constitutional:      General: She is not in acute distress.    Appearance: Normal appearance. She is normal weight. She is not ill-appearing, toxic-appearing or diaphoretic.  HENT:     Head: Normocephalic.     Mouth/Throat:     Pharynx: Oropharynx is clear. Uvula midline.     Tonsils: No tonsillar exudate.  Cardiovascular:     Rate and Rhythm: Normal rate and regular rhythm.  Pulmonary:     Effort: Pulmonary effort is normal.  Musculoskeletal:        General: Normal range of motion.     Right lower leg: No edema.     Left lower leg: No edema.  Feet:     Comments: Severe pain ellicitated on flexion of left foot Slight palpable pain anterior foot left side middle aspect   Neurological:     General: No focal deficit present.     Mental Status: She is alert and oriented to person, place, and time. Mental status is at baseline.  Psychiatric:        Mood and Affect: Mood normal.        Behavior: Behavior normal.        Thought Content: Thought content normal.        Judgment: Judgment normal.          Results RADIOLOGY Mammogram: Stable, tiny, well-defined, benign-appearing mass (03/04/2024) Left foot X-ray: Mildly displaced fracture fragment of indeterminate age along the dorsal aspect of the midfoot; moderate severity degenerative changes throughout the midfoot (08/20/2022)  Assessment & Plan:   Assessment and Plan Assessment & Plan Urinary incontinence Severe urinary incontinence with continuous leakage, requiring pads. Previous pelvic sling procedure in 1999 and bladder lift during hysterectomy. Symptoms suggest possible bladder prolapse. - Refer to urology for assessment of bladder function and potential prolapse. - Discuss potential use of pessary or need for repeat sling procedure with  urology.  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 Pain is stabbing, particularly at night and after prolonged sitting. Swelling noted. - Order left foot x-ray to assess current status of fracture and arthritis. - Recommend use of Voltaren  gel daily for anti-inflammatory effect. - Advise wearing supportive shoes with orthopedic inserts for stability. - Consider referral to podiatry or sports medicine if x-ray indicates further intervention is needed.  Hypertension Hypertension managed with benazepril  and carvedilol .  Type 2 diabetes mellitus Type 2 diabetes mellitus.  Hyperlipidemia Hyperlipidemia managed with rosuvastatin  three times a week due to previous leg pain with daily  dosing. Current regimen is well tolerated. - Continue rosuvastatin  three times a week.  -CHA?DS?-VASc Score 5 points Anticoag is reasonable, continue asa 81 mg once daily  Discussed not to take with NSAIDS increased bleeding risk   Asthma Asthma well controlled with Singulair  and albuterol inhaler. No recent exacerbations or need for nebulizer use. - Send prescription for albuterol inhaler to [facility].  History of endometrial cancer, status post hysterectomy Stage 1 endometrial cancer, status post total hysterectomy in 2018. Follows up annually with gynecologist at a cancer center in [place]. Past five-year mark with stable follow-up. - Discuss with current oncologist or gynecologist about the need for local follow-up. - Offer to arrange local gynecologist referral if desired.        Return in about 6 months (around 03/14/2025) for f/u CPE.     Ginger Patrick, MSN, APRN, FNP-C [facility]

## 2024-09-14 ENCOUNTER — Encounter: Payer: Self-pay | Admitting: Family

## 2024-09-14 DIAGNOSIS — N3945 Continuous leakage: Secondary | ICD-10-CM

## 2024-09-14 NOTE — Telephone Encounter (Signed)
 [facility] [place]. I had to leave a message. Advised pt via phone that I called to inquire about getting her appointment switch over if possible.

## 2024-09-14 NOTE — Telephone Encounter (Unsigned)
 Copied from CRM #[id number]. Topic: Referral - Status >> Sep 14, 2024  4:12 PM Drema MATSU wrote: Reason for CRM: Erica is returning a call from [place]. She stated that the diagnosis provided there is only a female provider that specializes in that. She stated that patient is aware and has agreed and has been scheduled.

## 2024-09-15 NOTE — Addendum Note (Signed)
 Addended by: ALBINO SHAVER C on: 09/15/2024 08:32 AM   Modules accepted: Orders

## 2024-09-20 ENCOUNTER — Telehealth: Payer: Self-pay | Admitting: Family

## 2024-09-20 NOTE — Telephone Encounter (Signed)
 I am not sure who handles the lab processes This particular patient I sent over to lab for blood work. She couldn't pee for us  yet for a urine dip stick so I sent over a urine card with the patient and had the poct dip in the orders. Lab neglected to collect. This was the second pt of the day this happened with (I will try to send along the other one as well). We typically do try to collect dips on our end but if the pt can not give one we send them over with a water and they say they'll try over at lab.

## 2024-09-27 NOTE — Addendum Note (Signed)
 Addended by: CORWIN ANTU on: 09/27/2024 06:49 AM   Modules accepted: Orders

## 2024-09-28 NOTE — Telephone Encounter (Signed)
 Not sure why this came to me, I do not handle Lab processes. That is a clinical, in office process.   You may want to reach out to Terri in the lab.   It does not look like Leita has addressed this either.

## 2024-09-28 NOTE — Telephone Encounter (Signed)
 can we please change urology referral to a location with a FEMALE provider  Pt is not comfortable with a female

## 2024-09-29 NOTE — Addendum Note (Signed)
 Addended by: CORWIN ANTU on: 09/29/2024 11:33 AM   Modules accepted: Orders

## 2024-09-29 NOTE — Telephone Encounter (Signed)
 Please close urology referral Placed new referral for urogyn

## 2024-10-01 NOTE — Telephone Encounter (Signed)
 I sent over the urine card with the patient, would that not prompt the lab to look if there a urine order of any sort? Just trying to iron out how we can make this more effective because many times my MA is busy and can not run over or take care of it at the moment I am sending the patient to lab so I send them with the card.

## 2024-10-06 NOTE — Telephone Encounter (Signed)
 Noted  Done

## 2024-10-11 ENCOUNTER — Encounter: Payer: Self-pay | Admitting: Family

## 2024-11-11 ENCOUNTER — Other Ambulatory Visit: Payer: Self-pay | Admitting: Internal Medicine

## 2024-11-11 DIAGNOSIS — I10 Essential (primary) hypertension: Secondary | ICD-10-CM

## 2024-11-19 ENCOUNTER — Encounter: Payer: Self-pay | Admitting: Family

## 2024-11-19 DIAGNOSIS — I10 Essential (primary) hypertension: Secondary | ICD-10-CM

## 2024-11-19 MED ORDER — BENAZEPRIL HCL 40 MG PO TABS: 40.0000 mg | ORAL_TABLET | Freq: Every day | ORAL | 1 refills | Status: AC

## 2024-11-29 ENCOUNTER — Ambulatory Visit: Admitting: Urology

## 2024-12-01 ENCOUNTER — Telehealth: Admitting: Family Medicine

## 2024-12-01 DIAGNOSIS — R6889 Other general symptoms and signs: Secondary | ICD-10-CM

## 2024-12-01 MED ORDER — BENZONATATE 100 MG PO CAPS: 100.0000 mg | ORAL_CAPSULE | Freq: Three times a day (TID) | ORAL | 0 refills | Status: AC | PRN

## 2024-12-01 MED ORDER — OSELTAMIVIR PHOSPHATE 75 MG PO CAPS: 75.0000 mg | ORAL_CAPSULE | Freq: Two times a day (BID) | ORAL | 0 refills | Status: AC

## 2024-12-01 NOTE — Progress Notes (Signed)
 E visit for Flu like symptoms   We are sorry that you are not feeling well.  Here is how we plan to help! Based on what you have shared with me it looks like you may have possible exposure to a virus that causes influenza.  Influenza or the flu is  an infection caused by a respiratory virus. The flu virus is highly contagious and persons who did not receive their yearly flu vaccination may catch the flu from close contact.  We have anti-viral medications to treat the viruses that cause this infection. They are not a cure and only shorten the course of the infection. These prescriptions are most effective when they are given within the first 2 days of flu symptoms. Antiviral medications are indicated if you have a high risk of complications from the flu. You should  also consider an antiviral medication if you are in close contact with someone who is at risk. These medications can help patients avoid complications from the flu but have side effects that you should know.   Possible side effects from Tamiflu  or oseltamivir  include nausea, vomiting, diarrhea, dizziness, headaches, eye redness, sleep problems or other respiratory symptoms. You should not take Tamiflu  if you have an allergy to oseltamivir  or any to the ingredients in Tamiflu .  Based upon your symptoms and potential risk factors I have prescribed Oseltamivir  (Tamiflu ).  It has been sent to your designated pharmacy.  You will take one 75 mg capsule orally twice a day for the next 5 days.   For nasal congestion, you may use an oral decongestant such as Mucinex D or if you have glaucoma or high blood pressure use plain Mucinex.  Saline nasal spray or nasal drops can help and can safely be used as often as needed for congestion.  If you have a sore or scratchy throat, use a saltwater gargle-  to  teaspoon of salt dissolved in a 4-ounce to 8-ounce glass of warm water.  Gargle the solution for approximately 15-30 seconds and then spit.   It is important not to swallow the solution.  You can also use throat lozenges/cough drops and Chloraseptic spray to help with throat pain or discomfort.  Warm or cold liquids can also be helpful in relieving throat pain.  For headache, pain or general discomfort, you can use Ibuprofen  or Tylenol as directed.   Some authorities believe that zinc sprays or the use of Echinacea may shorten the course of your symptoms.  I have prescribed the following medications to help lessen symptoms: I have prescribed Tessalon  Perles 100 mg. You may take 1-2 capsules every 8 hours as needed for cough  You are to isolate at home until you have been fever-free for at least 24 hours without a fever-reducing medication, and symptoms have been steadily improving for 24 hours.  If you must be around other household members who do not have symptoms, you need to make sure that both you and the family members are masking consistently with a high-quality mask.  If you note any worsening of symptoms despite treatment, please seek an in-person evaluation ASAP. If you note any significant shortness of breath or any chest pain, please seek ED evaluation. Please do not delay care!  ANYONE WHO HAS FLU SYMPTOMS SHOULD: Stay home. The flu is highly contagious and going out or to work exposes others! Be sure to drink plenty of fluids. Water is fine as well as fruit juices, sodas and electrolyte beverages. You may want to  stay away from caffeine or alcohol. If you are nauseated, try taking small sips of liquids. How do you know if you are getting enough fluid? Your urine should be a pale yellow or almost colorless. Get rest. Taking a steamy shower or using a humidifier may help nasal congestion and ease sore throat pain. Using a saline nasal spray works much the same way. Cough drops, hard candies and sore throat lozenges may ease your cough. Line up a caregiver. Have someone check on you regularly.  GET HELP RIGHT AWAY IF: You  cannot keep down liquids or your medications. You become short of breath Your fell like you are going to pass out or loose consciousness. Your symptoms persist after you have completed your treatment plan  MAKE SURE YOU  Understand these instructions. Will watch your condition. Will get help right away if you are not doing well or get worse.  Your e-visit answers were reviewed by a board certified advanced clinical practitioner to complete your personal care plan.  Depending on the condition, your plan could have included both over the counter or prescription medications.  If there is a problem please reply  once you have received a response from your provider.  Your safety is important to us .  If you have drug allergies check your prescription carefully.    You can use MyChart to ask questions about todays visit, request a non-urgent call back, or ask for a work or school excuse for 24 hours related to this e-Visit. If it has been greater than 24 hours you will need to follow up with your provider, or enter a new e-Visit to address those concerns.  You will get an e-mail in the next two days asking about your experience.  I hope that your e-visit has been valuable and will speed your recovery. Thank you for using e-visits.   I have spent 5 minutes in review of e-visit questionnaire, review and updating patient chart, medical decision making and response to patient.   Roosvelt Mater, PA-C

## 2024-12-03 ENCOUNTER — Encounter: Payer: Self-pay | Admitting: Family

## 2024-12-03 DIAGNOSIS — J4531 Mild persistent asthma with (acute) exacerbation: Secondary | ICD-10-CM

## 2024-12-03 MED ORDER — PREDNISONE 20 MG PO TABS: 40.0000 mg | ORAL_TABLET | Freq: Every day | ORAL | 0 refills | Status: AC

## 2024-12-03 MED ORDER — PREDNISONE 20 MG PO TABS: ORAL_TABLET | ORAL | 0 refills | Status: DC

## 2024-12-03 NOTE — Telephone Encounter (Signed)
 Please see the MyChart message reply(ies) for my assessment and plan.  The patient gave consent for this Medical Advice Message and is aware that it may result in a bill to their insurance company as well as the possibility that this may result in a co-payment or deductible. They are an established patient, but are not seeking medical advice exclusively about a problem treated during an in person or video visit in the last 7 days. I did not recommend an in person or video visit within 7 days of my reply.  I spent a total of 3 minutes cumulative time within 7 days through [employer] Ginger Patrick, FNP

## 2024-12-03 NOTE — Addendum Note (Signed)
 Addended by: KALLIE CLOTILDA SQUIBB on: 12/03/2024 09:18 AM   Modules accepted: Orders

## 2024-12-06 NOTE — Telephone Encounter (Signed)
 Prednisone  should help with the cough  If the symptoms persist however I'd advise she be seen in the office to evaluate further as she only initially seen via e visit.

## 2024-12-24 ENCOUNTER — Telehealth: Payer: Self-pay | Admitting: Family

## 2024-12-24 NOTE — Telephone Encounter (Signed)
 Viewed chart unsure of why pt was called pt has another PCP.  KP

## 2024-12-24 NOTE — Telephone Encounter (Signed)
 Copied from CRM [id number]. Topic: General - Call Back - No Documentation >> Dec 24, 2024 10:44 AM China J wrote: Reason for CRM: Patient received a call from Dr. Arvin office but there were no encounters pertaining to why.

## 2025-02-09 ENCOUNTER — Ambulatory Visit: Payer: PRIVATE HEALTH INSURANCE

## 2025-03-02 ENCOUNTER — Ambulatory Visit: Admitting: Obstetrics and Gynecology
